# Patient Record
Sex: Male | Born: 1950 | Race: White | Hispanic: No | Smoking: Former smoker
Health system: Southern US, Community
[De-identification: ages and names within clinical notes are randomized; demographics above are authoritative.]

## PROBLEM LIST (undated history)

## (undated) DIAGNOSIS — J45909 Unspecified asthma, uncomplicated: Secondary | ICD-10-CM

## (undated) DIAGNOSIS — I5189 Other ill-defined heart diseases: Secondary | ICD-10-CM

## (undated) DIAGNOSIS — M1990 Unspecified osteoarthritis, unspecified site: Secondary | ICD-10-CM

## (undated) DIAGNOSIS — I10 Essential (primary) hypertension: Secondary | ICD-10-CM

## (undated) DIAGNOSIS — J439 Emphysema, unspecified: Secondary | ICD-10-CM

## (undated) DIAGNOSIS — M109 Gout, unspecified: Secondary | ICD-10-CM

## (undated) DIAGNOSIS — IMO0002 Reserved for concepts with insufficient information to code with codable children: Secondary | ICD-10-CM

## (undated) DIAGNOSIS — Z8701 Personal history of pneumonia (recurrent): Secondary | ICD-10-CM

## (undated) HISTORY — PX: OTHER SURGICAL HISTORY: SHX169

## (undated) HISTORY — PX: VASECTOMY: SHX75

## (undated) HISTORY — PX: DENTAL SURGERY: SHX609

## (undated) HISTORY — DX: Reserved for concepts with insufficient information to code with codable children: IMO0002

## (undated) HISTORY — DX: Essential (primary) hypertension: I10

## (undated) HISTORY — DX: Personal history of pneumonia (recurrent): Z87.01

## (undated) HISTORY — DX: Other ill-defined heart diseases: I51.89

## (undated) HISTORY — PX: CIRCUMCISION: SUR203

## (undated) HISTORY — DX: Emphysema, unspecified: J43.9

---

## 2014-06-18 ENCOUNTER — Emergency Department (HOSPITAL_COMMUNITY): Payer: Self-pay

## 2014-06-18 ENCOUNTER — Inpatient Hospital Stay (HOSPITAL_COMMUNITY)
Admission: EM | Admit: 2014-06-18 | Discharge: 2014-06-20 | DRG: 193 | Disposition: A | Discharge status: Home / Self Care | Payer: Self-pay | Attending: Family Medicine | Admitting: Family Medicine

## 2014-06-18 ENCOUNTER — Encounter (HOSPITAL_COMMUNITY): Payer: Self-pay | Admitting: Emergency Medicine

## 2014-06-18 DIAGNOSIS — Z6832 Body mass index (BMI) 32.0-32.9, adult: Secondary | ICD-10-CM

## 2014-06-18 DIAGNOSIS — I10 Essential (primary) hypertension: Secondary | ICD-10-CM | POA: Insufficient documentation

## 2014-06-18 DIAGNOSIS — R0902 Hypoxemia: Secondary | ICD-10-CM | POA: Diagnosis present

## 2014-06-18 DIAGNOSIS — I272 Other secondary pulmonary hypertension: Secondary | ICD-10-CM | POA: Diagnosis present

## 2014-06-18 DIAGNOSIS — J90 Pleural effusion, not elsewhere classified: Secondary | ICD-10-CM | POA: Diagnosis present

## 2014-06-18 DIAGNOSIS — F17220 Nicotine dependence, chewing tobacco, uncomplicated: Secondary | ICD-10-CM | POA: Diagnosis present

## 2014-06-18 DIAGNOSIS — T501X5A Adverse effect of loop [high-ceiling] diuretics, initial encounter: Secondary | ICD-10-CM | POA: Diagnosis present

## 2014-06-18 DIAGNOSIS — I426 Alcoholic cardiomyopathy: Secondary | ICD-10-CM | POA: Diagnosis present

## 2014-06-18 DIAGNOSIS — I509 Heart failure, unspecified: Secondary | ICD-10-CM

## 2014-06-18 DIAGNOSIS — J9601 Acute respiratory failure with hypoxia: Secondary | ICD-10-CM | POA: Diagnosis present

## 2014-06-18 DIAGNOSIS — M25519 Pain in unspecified shoulder: Secondary | ICD-10-CM | POA: Diagnosis present

## 2014-06-18 DIAGNOSIS — D696 Thrombocytopenia, unspecified: Secondary | ICD-10-CM | POA: Diagnosis present

## 2014-06-18 DIAGNOSIS — I5032 Chronic diastolic (congestive) heart failure: Secondary | ICD-10-CM | POA: Diagnosis present

## 2014-06-18 DIAGNOSIS — E876 Hypokalemia: Secondary | ICD-10-CM | POA: Diagnosis present

## 2014-06-18 DIAGNOSIS — R0602 Shortness of breath: Secondary | ICD-10-CM

## 2014-06-18 DIAGNOSIS — R509 Fever, unspecified: Secondary | ICD-10-CM

## 2014-06-18 DIAGNOSIS — D6959 Other secondary thrombocytopenia: Secondary | ICD-10-CM | POA: Diagnosis present

## 2014-06-18 DIAGNOSIS — F1010 Alcohol abuse, uncomplicated: Secondary | ICD-10-CM | POA: Diagnosis present

## 2014-06-18 DIAGNOSIS — D649 Anemia, unspecified: Secondary | ICD-10-CM | POA: Diagnosis present

## 2014-06-18 DIAGNOSIS — M109 Gout, unspecified: Secondary | ICD-10-CM | POA: Diagnosis present

## 2014-06-18 DIAGNOSIS — F1290 Cannabis use, unspecified, uncomplicated: Secondary | ICD-10-CM | POA: Diagnosis present

## 2014-06-18 DIAGNOSIS — Z23 Encounter for immunization: Secondary | ICD-10-CM

## 2014-06-18 DIAGNOSIS — Z8673 Personal history of transient ischemic attack (TIA), and cerebral infarction without residual deficits: Secondary | ICD-10-CM

## 2014-06-18 DIAGNOSIS — J189 Pneumonia, unspecified organism: Principal | ICD-10-CM | POA: Diagnosis present

## 2014-06-18 HISTORY — DX: Gout, unspecified: M10.9

## 2014-06-18 LAB — CBC
HCT: 33.6 % — ABNORMAL LOW (ref 39.0–52.0)
Hemoglobin: 11 g/dL — ABNORMAL LOW (ref 13.0–17.0)
MCH: 31.7 pg (ref 26.0–34.0)
MCHC: 32.7 g/dL (ref 30.0–36.0)
MCV: 96.8 fL (ref 78.0–100.0)
PLATELETS: 164 10*3/uL (ref 150–400)
RBC: 3.47 MIL/uL — AB (ref 4.22–5.81)
RDW: 19.4 % — ABNORMAL HIGH (ref 11.5–15.5)
WBC: 12.2 10*3/uL — ABNORMAL HIGH (ref 4.0–10.5)

## 2014-06-18 LAB — URINALYSIS, ROUTINE W REFLEX MICROSCOPIC
Glucose, UA: NEGATIVE mg/dL
Leukocytes, UA: NEGATIVE
Nitrite: NEGATIVE
Protein, ur: 30 mg/dL — AB
Urobilinogen, UA: 0.2 mg/dL (ref 0.0–1.0)
pH: 6 (ref 5.0–8.0)

## 2014-06-18 LAB — BASIC METABOLIC PANEL
Anion gap: 16 — ABNORMAL HIGH (ref 5–15)
BUN: 17 mg/dL (ref 6–23)
CO2: 27 mEq/L (ref 19–32)
CREATININE: 1.05 mg/dL (ref 0.50–1.35)
Calcium: 8.8 mg/dL (ref 8.4–10.5)
Chloride: 101 mEq/L (ref 96–112)
GFR calc non Af Amer: 74 mL/min — ABNORMAL LOW (ref >90)
GFR, EST AFRICAN AMERICAN: 85 mL/min — AB (ref >90)
GLUCOSE: 97 mg/dL (ref 70–99)
POTASSIUM: 2.9 meq/L — AB (ref 3.7–5.3)
Sodium: 144 mEq/L (ref 137–147)

## 2014-06-18 LAB — URINE MICROSCOPIC-ADD ON

## 2014-06-18 LAB — TROPONIN I

## 2014-06-18 LAB — PRO B NATRIURETIC PEPTIDE: Pro B Natriuretic peptide (BNP): 6259 pg/mL — ABNORMAL HIGH (ref 0–125)

## 2014-06-18 MED ORDER — CARVEDILOL 12.5 MG PO TABS
25.0000 mg | ORAL_TABLET | Freq: Two times a day (BID) | ORAL | Status: DC
  Administered 2014-06-18 – 2014-06-19 (×3): 25 mg via ORAL
  Filled 2014-06-18 (×4): qty 2

## 2014-06-18 MED ORDER — TRAMADOL HCL 50 MG PO TABS
50.0000 mg | ORAL_TABLET | Freq: Four times a day (QID) | ORAL | Status: DC | PRN
  Administered 2014-06-19 (×2): 50 mg via ORAL
  Filled 2014-06-18 (×2): qty 1

## 2014-06-18 MED ORDER — POTASSIUM CHLORIDE IN NACL 20-0.9 MEQ/L-% IV SOLN
INTRAVENOUS | Status: AC
  Administered 2014-06-18: 22:00:00 via INTRAVENOUS

## 2014-06-18 MED ORDER — PNEUMOCOCCAL VAC POLYVALENT 25 MCG/0.5ML IJ INJ
0.5000 mL | INJECTION | INTRAMUSCULAR | Status: AC
  Administered 2014-06-19: 0.5 mL via INTRAMUSCULAR
  Filled 2014-06-18: qty 0.5

## 2014-06-18 MED ORDER — INFLUENZA VAC SPLIT QUAD 0.5 ML IM SUSY
0.5000 mL | PREFILLED_SYRINGE | INTRAMUSCULAR | Status: AC
  Administered 2014-06-19: 0.5 mL via INTRAMUSCULAR
  Filled 2014-06-18: qty 0.5

## 2014-06-18 MED ORDER — CITALOPRAM HYDROBROMIDE 20 MG PO TABS
20.0000 mg | ORAL_TABLET | Freq: Every day | ORAL | Status: DC
  Administered 2014-06-18 – 2014-06-19 (×2): 20 mg via ORAL
  Filled 2014-06-18 (×2): qty 1

## 2014-06-18 MED ORDER — ACETAMINOPHEN 325 MG PO TABS
650.0000 mg | ORAL_TABLET | Freq: Four times a day (QID) | ORAL | Status: DC | PRN
  Administered 2014-06-19: 650 mg via ORAL
  Filled 2014-06-18: qty 2

## 2014-06-18 MED ORDER — ENOXAPARIN SODIUM 40 MG/0.4ML ~~LOC~~ SOLN
40.0000 mg | SUBCUTANEOUS | Status: DC
  Administered 2014-06-18: 40 mg via SUBCUTANEOUS
  Filled 2014-06-18: qty 0.4

## 2014-06-18 MED ORDER — LEVOFLOXACIN IN D5W 750 MG/150ML IV SOLN: 750.0000 mg | INTRAVENOUS | Status: DC

## 2014-06-18 MED ORDER — VITAMIN B-1 100 MG PO TABS
100.0000 mg | ORAL_TABLET | Freq: Every day | ORAL | Status: DC
  Administered 2014-06-19: 100 mg via ORAL
  Filled 2014-06-18: qty 1

## 2014-06-18 MED ORDER — ONDANSETRON HCL 4 MG/2ML IJ SOLN: 4.0000 mg | Freq: Three times a day (TID) | INTRAMUSCULAR | Status: AC | PRN

## 2014-06-18 MED ORDER — LEVOFLOXACIN IN D5W 750 MG/150ML IV SOLN
750.0000 mg | INTRAVENOUS | Status: DC
  Administered 2014-06-19: 750 mg via INTRAVENOUS
  Filled 2014-06-18: qty 150

## 2014-06-18 MED ORDER — LEVOFLOXACIN IN D5W 750 MG/150ML IV SOLN
750.0000 mg | Freq: Once | INTRAVENOUS | Status: AC
  Administered 2014-06-18: 750 mg via INTRAVENOUS
  Filled 2014-06-18: qty 150

## 2014-06-18 MED ORDER — LISINOPRIL 10 MG PO TABS
40.0000 mg | ORAL_TABLET | Freq: Two times a day (BID) | ORAL | Status: DC
  Administered 2014-06-18 – 2014-06-20 (×4): 40 mg via ORAL
  Filled 2014-06-18 (×4): qty 4

## 2014-06-18 MED ORDER — OXYCODONE HCL 5 MG PO TABS
5.0000 mg | ORAL_TABLET | Freq: Four times a day (QID) | ORAL | Status: DC | PRN
  Administered 2014-06-18 – 2014-06-20 (×4): 5 mg via ORAL
  Filled 2014-06-18 (×4): qty 1

## 2014-06-18 NOTE — ED Notes (Signed)
Got 50 mcg fentanyl IVP via EMS per Jeanice Lim in route to ER.

## 2014-06-18 NOTE — ED Notes (Signed)
Patient arrives from [facility] c/o left chest pain that started today and has been constant. H/o CHF. C/o left shoulder pain as well. States shoulder is worse than chest. No injury "for a long time" per patient. Patient received 325mg  ASA and 2 SL nitro at Office

## 2014-06-18 NOTE — ED Notes (Signed)
MD at bedside. 

## 2014-06-18 NOTE — ED Notes (Signed)
CRITICAL VALUE ALERT  Critical value received: Potassium  Date of notification:  06/18/2014  Time of notification:  3128  Critical value read back:Yes.    Nurse who received alert:  Allegra Lai, RN  MD notified (1st page):  Dr Sabra Heck  Time of first page:  [phone]

## 2014-06-18 NOTE — H&P (Signed)
Chief Complaint:  Not feeling well  HPI: 63 yo male h/o chf unk EF, htn comes in with a day or so of not feeling well, coughing and fever.  Some sob.  Had some cp with coughing.  No le edema or swelling.  No n/v/d.  No abd pain.  His neighbors kids have been sick a lot with uri symptoms.  Feeling better with tx received in ED and wishes to go home already.  Not on recent abx.  No dysuria.  Review of Systems:  Positive and negative as per HPI otherwise all other systems are negative  Past Medical History: Past Medical History  Diagnosis Date  . Gout   . Hypertension   . Stroke     TIA   Past Surgical History  Procedure Laterality Date  . Dental surgery    . Vasectomy    . Circumcision      Medications: Prior to Admission medications   Medication Sig Start Date End Date Taking? Authorizing Provider  thiamine (VITAMIN B-1) 100 MG tablet Take 100 mg by mouth daily.   Yes Historical Provider, MD    Allergies:  No Known Allergies  Social History:  reports that he has quit smoking. His smokeless tobacco use includes Chew. He reports that he drinks alcohol. He reports that he uses illicit drugs (Marijuana).  Family History: History reviewed. No pertinent family history.  Physical Exam: Filed Vitals:   06/18/14 1709 06/18/14 1712 06/18/14 1730  BP: 176/87 176/87 174/83  Pulse: 70 64 65  Temp:  100.6 F (38.1 C)   TempSrc:  Oral   Resp: 11 27 23   Height:  5\' 8"  (1.727 m)   Weight:  95.255 kg (210 lb)   SpO2: 95% 92% 91%   General appearance: alert, cooperative and no distress  Appears dry Head: Normocephalic, without obvious abnormality, atraumatic Eyes: negative Nose: Nares normal. Septum midline. Mucosa normal. No drainage or sinus tenderness. Neck: no JVD and supple, symmetrical, trachea midline Lungs: clear to auscultation bilaterally Heart: regular rate and rhythm, S1, S2 normal, no murmur, click, rub or gallop Abdomen: soft, non-tender; bowel sounds normal; no  masses,  no organomegaly Extremities: extremities normal, atraumatic, no cyanosis or edema Pulses: 2+ and symmetric Skin: Skin color, texture, turgor normal. No rashes or lesions    Labs on Admission:   Recent Labs  06/18/14 1732  NA 144  K 2.9*  CL 101  CO2 27  GLUCOSE 97  BUN 17  CREATININE 1.05  CALCIUM 8.8    Recent Labs  06/18/14 1732  WBC 12.2*  HGB 11.0*  HCT 33.6*  MCV 96.8  PLT 164    Recent Labs  06/18/14 1732  TROPONINI <0.30   Radiological Exams on Admission: Dg Chest 2 View  06/18/2014   CLINICAL DATA:  Left-sided chest pain radiating to the shoulder and arm. Shortness of breath. Symptoms began today. Personal history of hypertension congestive heart failure.  EXAM: CHEST  2 VIEW  COMPARISON:  None.  FINDINGS: Artifact overlies the chest. The heart is enlarged. There is calcification of the thoracic aorta. There are small bilateral pleural effusions. There is patchy density both lung bases could represent atelectasis and or mild basilar pneumonia. The upper lungs are clear. No acute bony finding.  IMPRESSION: Cardiomegaly. Small effusions. Basilar atelectasis and or pneumonia. Findings could go along with low level congestive heart failure.   Electronically Signed   By: Nelson Chimes M.D.   On: 06/18/2014 17:59  Assessment/Plan  63 yo male with h/o chf now with CAP  Principal Problem:   CAP (community acquired pneumonia)-  pna pathway.  Levaquin.  Mildly hypoxic.  Keep overnight on gentle ivf.  Would walk and ck oxygen sats.  If nml, consider d/c tomorrow afternoon on levaquin if he feels up to it.  Lasix is held.  Active Problems:   SOB (shortness of breath)   CHF with unknown LVEF-  Lasix is held.  Will ck echo in am to get better idea of how significant his systolic dysfunction is if any.   Pleural effusion, bilateral   Hypertension   Hypokalemia-  Replete iv and thru ivf overnight   Hypoxia  Admit to tele.  Full code.  Arlett Goold  A 06/18/2014, 7:44 PM

## 2014-06-18 NOTE — ED Provider Notes (Signed)
CSN: [id number]     Arrival date & time 06/18/14  1707 History   First MD Initiated Contact with Patient 06/18/14 1713     Chief Complaint  Patient presents with  . Chest Pain     (Consider location/radiation/quality/duration/timing/severity/associated sxs/prior Treatment) HPI Comments: The patient is a 63 year old male with a history of gout, hypertension and a stroke who also has congestive heart failure. He was seen at his family doctor's office today because of left-sided chest pain that started when he woke up this morning. It is located in the left chest, it does not radiate, it is associated with some shortness of breath and he notes that he was up all night long last night urinating frequently. He denies any peripheral edema, he has not had recent surgery though he was hospitalized in July for 2 nights at [facility] because of what he describes as low potassium. The patient denies any lower extremity edema, injuries, immobilization or prior DVT. At the family doctor's office he was given aspirin and nitroglycerin and sent to the emergency department because of his ongoing chest pain. The patient also reports that he has a history of gout and has been having pain in his hands and his left shoulder, he has pains in the bilateral ankles, these are common for him. He denies having fever, denies neck pain, denies radiation of this chest pain. He describes it as sharp, he also describes it as dull and then says "I'm not good at describing these things".  Patient is a 63 y.o. male presenting with chest pain. The history is provided by the patient.  Chest Pain   Past Medical History  Diagnosis Date  . Gout   . Hypertension   . Stroke     TIA  . CHF (congestive heart failure)     systolic   Past Surgical History  Procedure Laterality Date  . Dental surgery    . Vasectomy    . Circumcision     History reviewed. No pertinent family history. History  Substance Use  Topics  . Smoking status: Former Research scientist (life sciences)  . Smokeless tobacco: Current User    Types: Chew  . Alcohol Use: Yes    Review of Systems  Cardiovascular: Positive for chest pain.  All other systems reviewed and are negative.     Allergies  Review of patient's allergies indicates no known allergies.  Home Medications   Prior to Admission medications   Medication Sig Start Date End Date Taking? Authorizing Provider  carvedilol (COREG) 25 MG tablet Take 25 mg by mouth 2 (two) times daily with a meal.   Yes Historical Provider, MD  citalopram (CELEXA) 20 MG tablet Take 20 mg by mouth daily.   Yes Historical Provider, MD  furosemide (LASIX) 40 MG tablet Take 40 mg by mouth daily.   Yes Historical Provider, MD  lisinopril (PRINIVIL,ZESTRIL) 40 MG tablet Take 40 mg by mouth 2 (two) times daily.   Yes Historical Provider, MD  thiamine (VITAMIN B-1) 100 MG tablet Take 100 mg by mouth daily.   Yes Historical Provider, MD   BP 143/63 mmHg  Pulse 70  Temp(Src) 100.3 F (37.9 C) (Oral)  Resp 20  Ht 5\' 8"  (1.727 m)  Wt 214 lb 4.6 oz (97.2 kg)  BMI 32.59 kg/m2  SpO2 93% Physical Exam  Constitutional: He appears well-developed and well-nourished. No distress.  HENT:  Head: Normocephalic and atraumatic.  Mouth/Throat: Oropharynx is clear and moist. No oropharyngeal exudate.  Eyes: Conjunctivae and EOM are normal. Pupils are equal, round, and reactive to light. Right eye exhibits no discharge. Left eye exhibits no discharge. No scleral icterus.  Neck: Normal range of motion. Neck supple. No JVD present. No thyromegaly present.  Cardiovascular: Normal rate, regular rhythm, normal heart sounds and intact distal pulses.  Exam reveals no gallop and no friction rub.   No murmur heard. Pulmonary/Chest: Breath sounds normal. No respiratory distress. He has no wheezes. He has no rales.  Mild tachypnea, speaks in full sentences  Abdominal: Soft. Bowel sounds are normal. He exhibits no distension and no  mass. There is no tenderness.  Musculoskeletal: Normal range of motion. He exhibits no edema or tenderness.  Lymphadenopathy:    He has no cervical adenopathy.  Neurological: He is alert. Coordination normal.  Skin: Skin is warm and dry. No rash noted. No erythema.  Psychiatric: He has a normal mood and affect. His behavior is normal.  Nursing note and vitals reviewed.   ED Course  Procedures (including critical care time) Labs Review Labs Reviewed  BASIC METABOLIC PANEL - Abnormal; Notable for the following:    Potassium 2.9 (*)    GFR calc non Af Amer 74 (*)    GFR calc Af Amer 85 (*)    Anion gap 16 (*)    All other components within normal limits  CBC - Abnormal; Notable for the following:    WBC 12.2 (*)    RBC 3.47 (*)    Hemoglobin 11.0 (*)    HCT 33.6 (*)    RDW 19.4 (*)    All other components within normal limits  URINALYSIS, ROUTINE W REFLEX MICROSCOPIC - Abnormal; Notable for the following:    Specific Gravity, Urine >1.030 (*)    Hgb urine dipstick MODERATE (*)    Bilirubin Urine SMALL (*)    Ketones, ur TRACE (*)    Protein, ur 30 (*)    All other components within normal limits  PRO B NATRIURETIC PEPTIDE - Abnormal; Notable for the following:    Pro B Natriuretic peptide (BNP) 6259.0 (*)    All other components within normal limits  BASIC METABOLIC PANEL - Abnormal; Notable for the following:    Potassium 3.1 (*)    Glucose, Bld 105 (*)    Calcium 8.3 (*)    GFR calc non Af Amer 86 (*)    All other components within normal limits  CBC WITH DIFFERENTIAL - Abnormal; Notable for the following:    WBC 11.9 (*)    RBC 3.35 (*)    Hemoglobin 10.7 (*)    HCT 32.2 (*)    RDW 19.1 (*)    Platelets 128 (*)    Neutrophils Relative % 81 (*)    Neutro Abs 9.7 (*)    Lymphocytes Relative 8 (*)    Monocytes Absolute 1.3 (*)    All other components within normal limits  URIC ACID - Abnormal; Notable for the following:    Uric Acid, Serum 9.8 (*)    All other  components within normal limits  CULTURE, BLOOD (ROUTINE X 2)  CULTURE, BLOOD (ROUTINE X 2)  TROPONIN I  URINE MICROSCOPIC-ADD ON  STREP PNEUMONIAE URINARY ANTIGEN  LEGIONELLA ANTIGEN, URINE    Imaging Review Dg Chest 2 View  06/18/2014   CLINICAL DATA:  Left-sided chest pain radiating to the shoulder and arm. Shortness of breath. Symptoms began today. Personal history of hypertension congestive heart failure.  EXAM: CHEST  2 VIEW  COMPARISON:  None.  FINDINGS: Artifact overlies the chest. The heart is enlarged. There is calcification of the thoracic aorta. There are small bilateral pleural effusions. There is patchy density both lung bases could represent atelectasis and or mild basilar pneumonia. The upper lungs are clear. No acute bony finding.  IMPRESSION: Cardiomegaly. Small effusions. Basilar atelectasis and or pneumonia. Findings could go along with low level congestive heart failure.   Electronically Signed   By: Nelson Chimes M.D.   On: 06/18/2014 17:59     EKG Interpretation   Date/Time:  Tuesday June 18 2014 17:11:42 EST Ventricular Rate:  67 PR Interval:  202 QRS Duration: 89 QT Interval:  538 QTC Calculation: 568 R Axis:   51 Text Interpretation:  Sinus rhythm Nonspecific T wave abnormality  Prolonged QT interval Baseline wander in lead(s) I II aVR V6 No old  tracing to compare Confirmed by Gursimran Litaker  MD, [place] ([id number]) on 06/18/2014  5:29:13 PM      MDM   Final diagnoses:  CAP (community acquired pneumonia)  Hypokalemia    The patient has an EKG which is rather unremarkable, his respiratory pattern is tachypneic but there is no abnormal lung sounds, his oxygen is 94% on room air but he does appear mildly dyspneic. His vital signs suggest a slight fever of 100.6, he is not tachycardic, his blood pressure is elevated and his oxygen level is slightly low. I will obtain a chest x-ray and lab work   The patient will be admitted to the hospitalist service, labs confirm  a mild leukocytosis as well as an elevated BNP consistent with congestive heart failure. Discussed with hospitalist  Meds given in ED:  Medications  ondansetron (ZOFRAN) injection 4 mg (not administered)  lisinopril (PRINIVIL,ZESTRIL) tablet 40 mg (40 mg Oral Given 06/19/14 0812)  carvedilol (COREG) tablet 25 mg (25 mg Oral Given 06/19/14 0812)  citalopram (CELEXA) tablet 20 mg (20 mg Oral Given 06/18/14 2243)  enoxaparin (LOVENOX) injection 40 mg (40 mg Subcutaneous Given 06/18/14 2152)  0.9 % NaCl with KCl 20 mEq/ L  infusion ( Intravenous Not Given 06/19/14 0851)  levofloxacin (LEVAQUIN) IVPB 750 mg (not administered)  acetaminophen (TYLENOL) tablet 650 mg (650 mg Oral Given 06/19/14 1502)  traMADol (ULTRAM) tablet 50 mg (50 mg Oral Given 06/19/14 0229)  oxyCODONE (Oxy IR/ROXICODONE) immediate release tablet 5 mg (5 mg Oral Given 06/19/14 1022)  ipratropium-albuterol (DUONEB) 0.5-2.5 (3) MG/3ML nebulizer solution 3 mL (3 mLs Nebulization Given 06/19/14 1501)  predniSONE (DELTASONE) tablet 50 mg (50 mg Oral Given 06/19/14 1005)  0.9 % NaCl with KCl 20 mEq/ L  infusion ( Intravenous New Bag/Given 06/19/14 1019)  LORazepam (ATIVAN) tablet 1 mg (not administered)    Or  LORazepam (ATIVAN) injection 1 mg (not administered)  thiamine (VITAMIN B-1) tablet 100 mg (100 mg Oral Given 06/19/14 1022)    Or  thiamine (B-1) injection 100 mg ( Intravenous See Alternative 95/18/84 1660)  folic acid (FOLVITE) tablet 1 mg (1 mg Oral Given 06/19/14 1022)  multivitamin with minerals tablet 1 tablet (1 tablet Oral Given 06/19/14 1022)  levofloxacin (LEVAQUIN) IVPB 750 mg (750 mg Intravenous Transfusing/Transfer 06/18/14 2108)  Influenza vac split quadrivalent PF (FLUARIX) injection 0.5 mL (0.5 mLs Intramuscular Given 06/19/14 1005)  pneumococcal 23 valent vaccine (PNU-IMMUNE) injection 0.5 mL (0.5 mLs Intramuscular Given 06/19/14 1005)  potassium chloride SA (K-DUR,KLOR-CON) CR tablet 40 mEq (40 mEq Oral  Given 06/19/14 1005)    Current Discharge Medication List  Johnna Acosta, MD 06/19/14 1600

## 2014-06-19 ENCOUNTER — Encounter (HOSPITAL_COMMUNITY): Payer: Self-pay | Admitting: Internal Medicine

## 2014-06-19 DIAGNOSIS — M109 Gout, unspecified: Secondary | ICD-10-CM | POA: Diagnosis present

## 2014-06-19 DIAGNOSIS — F1010 Alcohol abuse, uncomplicated: Secondary | ICD-10-CM | POA: Diagnosis present

## 2014-06-19 DIAGNOSIS — D696 Thrombocytopenia, unspecified: Secondary | ICD-10-CM | POA: Diagnosis present

## 2014-06-19 DIAGNOSIS — I359 Nonrheumatic aortic valve disorder, unspecified: Secondary | ICD-10-CM

## 2014-06-19 DIAGNOSIS — M25519 Pain in unspecified shoulder: Secondary | ICD-10-CM | POA: Diagnosis present

## 2014-06-19 LAB — CBC WITH DIFFERENTIAL/PLATELET
BASOS ABS: 0 10*3/uL (ref 0.0–0.1)
BASOS PCT: 0 % (ref 0–1)
Eosinophils Absolute: 0 10*3/uL (ref 0.0–0.7)
Eosinophils Relative: 0 % (ref 0–5)
HCT: 32.2 % — ABNORMAL LOW (ref 39.0–52.0)
Hemoglobin: 10.7 g/dL — ABNORMAL LOW (ref 13.0–17.0)
Lymphocytes Relative: 8 % — ABNORMAL LOW (ref 12–46)
Lymphs Abs: 1 10*3/uL (ref 0.7–4.0)
MCH: 31.9 pg (ref 26.0–34.0)
MCHC: 33.2 g/dL (ref 30.0–36.0)
MCV: 96.1 fL (ref 78.0–100.0)
Monocytes Absolute: 1.3 10*3/uL — ABNORMAL HIGH (ref 0.1–1.0)
Monocytes Relative: 11 % (ref 3–12)
NEUTROS ABS: 9.7 10*3/uL — AB (ref 1.7–7.7)
NEUTROS PCT: 81 % — AB (ref 43–77)
PLATELETS: 128 10*3/uL — AB (ref 150–400)
RBC: 3.35 MIL/uL — ABNORMAL LOW (ref 4.22–5.81)
RDW: 19.1 % — AB (ref 11.5–15.5)
WBC: 11.9 10*3/uL — ABNORMAL HIGH (ref 4.0–10.5)

## 2014-06-19 LAB — STREP PNEUMONIAE URINARY ANTIGEN: STREP PNEUMO URINARY ANTIGEN: NEGATIVE

## 2014-06-19 LAB — BASIC METABOLIC PANEL
ANION GAP: 15 (ref 5–15)
BUN: 16 mg/dL (ref 6–23)
CALCIUM: 8.3 mg/dL — AB (ref 8.4–10.5)
CO2: 27 mEq/L (ref 19–32)
CREATININE: 0.97 mg/dL (ref 0.50–1.35)
Chloride: 98 mEq/L (ref 96–112)
GFR, EST NON AFRICAN AMERICAN: 86 mL/min — AB (ref >90)
Glucose, Bld: 105 mg/dL — ABNORMAL HIGH (ref 70–99)
Potassium: 3.1 mEq/L — ABNORMAL LOW (ref 3.7–5.3)
SODIUM: 140 meq/L (ref 137–147)

## 2014-06-19 LAB — URIC ACID: URIC ACID, SERUM: 9.8 mg/dL — AB (ref 4.0–7.8)

## 2014-06-19 MED ORDER — IPRATROPIUM-ALBUTEROL 0.5-2.5 (3) MG/3ML IN SOLN
3.0000 mL | RESPIRATORY_TRACT | Status: DC
  Administered 2014-06-19 (×3): 3 mL via RESPIRATORY_TRACT
  Filled 2014-06-19 (×3): qty 3

## 2014-06-19 MED ORDER — PREDNISONE 20 MG PO TABS: 50.0000 mg | ORAL_TABLET | Freq: Every day | ORAL | Status: DC

## 2014-06-19 MED ORDER — POTASSIUM CHLORIDE IN NACL 20-0.9 MEQ/L-% IV SOLN
INTRAVENOUS | Status: DC
  Administered 2014-06-19: 10:00:00 via INTRAVENOUS

## 2014-06-19 MED ORDER — FOLIC ACID 1 MG PO TABS
1.0000 mg | ORAL_TABLET | Freq: Every day | ORAL | Status: DC
  Administered 2014-06-19 – 2014-06-20 (×2): 1 mg via ORAL
  Filled 2014-06-19 (×2): qty 1

## 2014-06-19 MED ORDER — ADULT MULTIVITAMIN W/MINERALS CH
1.0000 | ORAL_TABLET | Freq: Every day | ORAL | Status: DC
  Administered 2014-06-19 – 2014-06-20 (×2): 1 via ORAL
  Filled 2014-06-19 (×2): qty 1

## 2014-06-19 MED ORDER — LORAZEPAM 2 MG/ML IJ SOLN: 1.0000 mg | Freq: Four times a day (QID) | INTRAMUSCULAR | Status: DC | PRN

## 2014-06-19 MED ORDER — IPRATROPIUM-ALBUTEROL 0.5-2.5 (3) MG/3ML IN SOLN
3.0000 mL | Freq: Four times a day (QID) | RESPIRATORY_TRACT | Status: DC
  Administered 2014-06-20 (×3): 3 mL via RESPIRATORY_TRACT
  Filled 2014-06-19 (×3): qty 3

## 2014-06-19 MED ORDER — ALBUTEROL SULFATE (2.5 MG/3ML) 0.083% IN NEBU: 2.5000 mg | INHALATION_SOLUTION | RESPIRATORY_TRACT | Status: DC | PRN

## 2014-06-19 MED ORDER — PREDNISONE 20 MG PO TABS
50.0000 mg | ORAL_TABLET | Freq: Every day | ORAL | Status: DC
  Administered 2014-06-19 – 2014-06-20 (×2): 50 mg via ORAL
  Filled 2014-06-19 (×2): qty 2
  Filled 2014-06-19 (×2): qty 1

## 2014-06-19 MED ORDER — THIAMINE HCL 100 MG/ML IJ SOLN: 100.0000 mg | Freq: Every day | INTRAMUSCULAR | Status: DC

## 2014-06-19 MED ORDER — POTASSIUM CHLORIDE CRYS ER 20 MEQ PO TBCR
40.0000 meq | EXTENDED_RELEASE_TABLET | Freq: Once | ORAL | Status: AC
  Administered 2014-06-19: 40 meq via ORAL
  Filled 2014-06-19: qty 2

## 2014-06-19 MED ORDER — LORAZEPAM 1 MG PO TABS: 1.0000 mg | ORAL_TABLET | Freq: Four times a day (QID) | ORAL | Status: DC | PRN

## 2014-06-19 MED ORDER — VITAMIN B-1 100 MG PO TABS
100.0000 mg | ORAL_TABLET | Freq: Every day | ORAL | Status: DC
  Administered 2014-06-19 – 2014-06-20 (×2): 100 mg via ORAL
  Filled 2014-06-19 (×2): qty 1

## 2014-06-19 NOTE — Progress Notes (Signed)
O2 Saturations  Patient's O2 saturation is 93% on room air at rest. Patient's O2 saturation is 95% on room air with activity.

## 2014-06-19 NOTE — Plan of Care (Signed)
Problem: Consults Goal: Skin Care Protocol Initiated - if Braden Score 18 or less If consults are not indicated, leave blank or document N/A  Outcome: Not Applicable Date Met:  30/13/14 Goal: Diabetes Guidelines if Diabetic/Glucose > 140 If diabetic or lab glucose is > 140 mg/dl - Initiate Diabetes/Hyperglycemia Guidelines & Document Interventions  Outcome: Not Applicable Date Met:  38/88/75  Problem: Phase I Progression Outcomes Goal: Dyspnea controlled at rest Outcome: Completed/Met Date Met:  06/19/14 Goal: First antibiotic given within 6hrs of admit Outcome: Completed/Met Date Met:  06/19/14

## 2014-06-19 NOTE — Progress Notes (Signed)
[facility] PROGRESS NOTE  Joseph Chen PRF:[id number] DOB: 1951-04-11 DOA: 06/18/2014 PCP: No primary care provider on file.  Assessment/Plan: CAP (community acquired pneumonia)- currently afebrile and non-toxic. Leukocytosis trending downward. Remains somewhat sob with audible tightness. Oxygen saturation level 97% on room air at rest. Strep pneumo and legionella antigens pending.  Continue Levaquin day #2. Prednisone 50mg  once with nebs.  will ambulate and monitor oxygen saturation level.   Active Problems:   CHF-BNP elevated.  chart review reveals last seen cards 2011 at [facility]. Echo dated 2010 yields EF 40%. Does not appear fluid overloaded in fact appears slightly dry. Some upper lobe wheeze/tightness. Continue to hold lasix. Provide nebs.  Lost to follow up until 03/2014 when he "got back on meds" with PCP Dr Bridget Hartshorn. Will continue coreg and lisinopril. Await echo. Continue IV fluids at slower rate. Daily weights and monitor intake and output.  Will likely need cards follow up.   SOB: likely multifactorial related to #1 and #2. No admitted or documented COPD but reports having inhaler. No hx smoking.  See above.    Hypertension: poor control with hx of same in setting of missed doses of meds. Lasix on hold as above. EKG with non-specific Twave abnormality and prolonged QT.  Will resume home medications and monitor.    Hypokalemia- trending up. Likely related to diuretics. EKG as above.  Will replete and recheck.   ETOH abuse: admits to 5-6 ozs hard liquor daily. Denies hx withdrawal. Will provide CIWA. Monitor  Thrombocytopenia: likely related to ETOH abuse. No s/sx bleeding. Will monitor.   Joint pain: left shoulder bilateral wrists. Hx gout. Chart review indicates used to take allopurinol. Will obtain uric acid level. Providing 50mg  prednisone. Consider colchecine or other NSAID.   Obesity: BMI 32. Nutritional consult   Code Status: full Family Communication: none  present Disposition Plan: home when ready likely tomorrow   Consultants:  none  Procedures:  Echo pending  Antibiotics:  Levaquin  HPI/Subjective: Sitting on side of bed. Reports continued SOB.  Objective: Filed Vitals:   06/19/14 0811  BP: 184/80  Pulse: 72  Temp:   Resp:     Intake/Output Summary (Last 24 hours) at 06/19/14 0904 Last data filed at 06/19/14 0700  Gross per 24 hour  Intake 646.25 ml  Output    576 ml  Net  70.25 ml   Filed Weights   06/18/14 1712 06/18/14 2128 06/19/14 0833  Weight: 95.255 kg (210 lb) 94.439 kg (208 lb 3.2 oz) 97.2 kg (214 lb 4.6 oz)    Exam:   General:  Obese appears comfortable  Cardiovascular: RRR No MGR No LE edema  Respiratory: mild increased work of breathing with conversation. Dry cough. No crackles. Diminished air flow bases. Some anterior wheeze/tightness  Abdomen: obese soft +BS non-tender to palpation  Musculoskeletal: left shoulder very tender to touch with very limited ROM. No swelling or heat. Bilateral wrists with mild edema/erythema and heat and very tender to touch with decreased ROM.    Data Reviewed: Basic Metabolic Panel:  Recent Labs Lab 06/18/14 1732 06/19/14 0622  NA 144 140  K 2.9* 3.1*  CL 101 98  CO2 27 27  GLUCOSE 97 105*  BUN 17 16  CREATININE 1.05 0.97  CALCIUM 8.8 8.3*   Liver Function Tests: No results for input(s): AST, ALT, ALKPHOS, BILITOT, PROT, ALBUMIN in the last 168 hours. No results for input(s): LIPASE, AMYLASE in the last 168 hours. No results for input(s): AMMONIA in the last  168 hours. CBC:  Recent Labs Lab 06/18/14 1732 06/19/14 0622  WBC 12.2* 11.9*  NEUTROABS  --  9.7*  HGB 11.0* 10.7*  HCT 33.6* 32.2*  MCV 96.8 96.1  PLT 164 128*   Cardiac Enzymes:  Recent Labs Lab 06/18/14 1732  TROPONINI <0.30   BNP (last 3 results)  Recent Labs  06/18/14 1732  PROBNP 6259.0*   CBG: No results for input(s): GLUCAP in the last 168 hours.  Recent  Results (from the past 240 hour(s))  Culture, blood (routine x 2) Call MD if unable to obtain prior to antibiotics being given     Status: None (Preliminary result)   Collection Time: 06/18/14  9:39 PM  Result Value Ref Range Status   Specimen Description BLOOD RIGHT ARM  Final   Special Requests BOTTLES DRAWN AEROBIC AND ANAEROBIC [place]  Final   Culture PENDING  Incomplete   Report Status PENDING  Incomplete  Culture, blood (routine x 2) Call MD if unable to obtain prior to antibiotics being given     Status: None (Preliminary result)   Collection Time: 06/18/14  9:39 PM  Result Value Ref Range Status   Specimen Description BLOOD RIGHT HAND  Final   Special Requests BOTTLES DRAWN AEROBIC AND ANAEROBIC 10CC EACH  Final   Culture PENDING  Incomplete   Report Status PENDING  Incomplete     Studies: Dg Chest 2 View  06/18/2014   CLINICAL DATA:  Left-sided chest pain radiating to the shoulder and arm. Shortness of breath. Symptoms began today. Personal history of hypertension congestive heart failure.  EXAM: CHEST  2 VIEW  COMPARISON:  None.  FINDINGS: Artifact overlies the chest. The heart is enlarged. There is calcification of the thoracic aorta. There are small bilateral pleural effusions. There is patchy density both lung bases could represent atelectasis and or mild basilar pneumonia. The upper lungs are clear. No acute bony finding.  IMPRESSION: Cardiomegaly. Small effusions. Basilar atelectasis and or pneumonia. Findings could go along with low level congestive heart failure.   Electronically Signed   By: Nelson Chimes M.D.   On: 06/18/2014 17:59    Scheduled Meds: . carvedilol  25 mg Oral BID WC  . citalopram  20 mg Oral Daily  . enoxaparin (LOVENOX) injection  40 mg Subcutaneous Q24H  . Influenza vac split quadrivalent PF  0.5 mL Intramuscular Tomorrow-1000  . ipratropium-albuterol  3 mL Nebulization Q4H  . levofloxacin (LEVAQUIN) IV  750 mg Intravenous Q24H  . lisinopril  40 mg  Oral BID  . pneumococcal 23 valent vaccine  0.5 mL Intramuscular Tomorrow-1000  . potassium chloride  40 mEq Oral Once  . predniSONE  50 mg Oral Q breakfast  . thiamine  100 mg Oral Daily   Continuous Infusions: . 0.9 % NaCl with KCl 20 mEq / L 75 mL/hr at 06/18/14 2151    Principal Problem:   CAP (community acquired pneumonia) Active Problems:   SOB (shortness of breath)   CHF with unknown LVEF   Fever   Pleural effusion, bilateral   Hypertension   Hypokalemia   Hypoxia   Pain in joint, shoulder region   Gout    Time spent: 45 minutes    [facility] Pager [phone]. If 7PM-7AM, please contact night-coverage at [email or web address], password [facility] 06/19/2014, 9:04 AM  LOS: 1 day

## 2014-06-19 NOTE — Progress Notes (Signed)
ANTIBIOTIC CONSULT NOTE - FOLLOW UP  Pharmacy Consult for Renal Dosing ABX Indication: pneumonia  No Known Allergies  Patient Measurements: Height: 5\' 8"  (172.7 cm) Weight: 214 lb 4.6 oz (97.2 kg) IBW/kg (Calculated) : 68.4   Vital Signs: Temp: 98.7 F (37.1 C) (11/11 0610) Temp Source: Oral (11/11 0610) BP: 184/80 mmHg (11/11 0811) Pulse Rate: 72 (11/11 0811) Intake/Output from previous day: 11/10 0701 - 11/11 0700 In: 646.3 [I.V.:646.3] Out: 576 [Urine:576] Intake/Output from this shift:    Labs:  Recent Labs  06/18/14 1732 06/19/14 0622  WBC 12.2* 11.9*  HGB 11.0* 10.7*  PLT 164 128*  CREATININE 1.05 0.97   Estimated Creatinine Clearance: 88.1 mL/min (by C-G formula based on Cr of 0.97). No results for input(s): VANCOTROUGH, VANCOPEAK, VANCORANDOM, GENTTROUGH, GENTPEAK, GENTRANDOM, TOBRATROUGH, TOBRAPEAK, TOBRARND, AMIKACINPEAK, AMIKACINTROU, AMIKACIN in the last 72 hours.   Microbiology: Recent Results (from the past 720 hour(s))  Culture, blood (routine x 2) Call MD if unable to obtain prior to antibiotics being given     Status: None (Preliminary result)   Collection Time: 06/18/14  9:39 PM  Result Value Ref Range Status   Specimen Description BLOOD RIGHT ARM  Final   Special Requests BOTTLES DRAWN AEROBIC AND ANAEROBIC [place]  Final   Culture PENDING  Incomplete   Report Status PENDING  Incomplete  Culture, blood (routine x 2) Call MD if unable to obtain prior to antibiotics being given     Status: None (Preliminary result)   Collection Time: 06/18/14  9:39 PM  Result Value Ref Range Status   Specimen Description BLOOD RIGHT HAND  Final   Special Requests BOTTLES DRAWN AEROBIC AND ANAEROBIC 10CC EACH  Final   Culture PENDING  Incomplete   Report Status PENDING  Incomplete    Anti-infectives    Start     Dose/Rate Route Frequency Ordered Stop   06/19/14 2000  levofloxacin (LEVAQUIN) IVPB 750 mg     750 mg100 mL/hr over 90 Minutes Intravenous Every  24 hours 06/18/14 2127 06/24/14 1959   06/18/14 2130  levofloxacin (LEVAQUIN) IVPB 750 mg  Status:  Discontinued     750 mg100 mL/hr over 90 Minutes Intravenous Every 24 hours 06/18/14 2122 06/18/14 2127   06/18/14 1930  levofloxacin (LEVAQUIN) IVPB 750 mg     750 mg100 mL/hr over 90 Minutes Intravenous  Once 06/18/14 1918 06/18/14 2120      Assessment: Okay for Protocol, no renal dysfunction noted.  Goal of Therapy:  Eradicate infection.   Plan:  Continue Levaquin 750mg  IV every 24 hours. Monitor labs and micro data.  Biagio Quint R 06/19/2014,10:31 AM

## 2014-06-19 NOTE — Care Management Utilization Note (Signed)
UR completed 

## 2014-06-19 NOTE — Progress Notes (Signed)
  Echocardiogram 2D Echocardiogram has been performed.  [place], [place] 06/19/2014, 3:05 PM

## 2014-06-20 LAB — BASIC METABOLIC PANEL
ANION GAP: 13 (ref 5–15)
BUN: 23 mg/dL (ref 6–23)
CALCIUM: 8.5 mg/dL (ref 8.4–10.5)
CO2: 27 mEq/L (ref 19–32)
Chloride: 99 mEq/L (ref 96–112)
Creatinine, Ser: 1.08 mg/dL (ref 0.50–1.35)
GFR, EST AFRICAN AMERICAN: 82 mL/min — AB (ref >90)
GFR, EST NON AFRICAN AMERICAN: 71 mL/min — AB (ref >90)
Glucose, Bld: 126 mg/dL — ABNORMAL HIGH (ref 70–99)
POTASSIUM: 3.8 meq/L (ref 3.7–5.3)
Sodium: 139 mEq/L (ref 137–147)

## 2014-06-20 LAB — CBC
HCT: 31.9 % — ABNORMAL LOW (ref 39.0–52.0)
Hemoglobin: 10.5 g/dL — ABNORMAL LOW (ref 13.0–17.0)
MCH: 31.9 pg (ref 26.0–34.0)
MCHC: 32.9 g/dL (ref 30.0–36.0)
MCV: 97 fL (ref 78.0–100.0)
PLATELETS: 145 10*3/uL — AB (ref 150–400)
RBC: 3.29 MIL/uL — AB (ref 4.22–5.81)
RDW: 19 % — AB (ref 11.5–15.5)
WBC: 16 10*3/uL — AB (ref 4.0–10.5)

## 2014-06-20 LAB — LEGIONELLA ANTIGEN, URINE

## 2014-06-20 MED ORDER — DOXYCYCLINE HYCLATE 100 MG PO TABS: 100.0000 mg | ORAL_TABLET | Freq: Two times a day (BID) | ORAL | Status: DC

## 2014-06-20 MED ORDER — LEVOFLOXACIN 750 MG PO TABS: 750.0000 mg | ORAL_TABLET | Freq: Every day | ORAL | Status: DC

## 2014-06-20 NOTE — Plan of Care (Signed)
Problem: Phase III Progression Outcomes Goal: O2 sats > or equal to 93% on room air Outcome: Adequate for Discharge

## 2014-06-20 NOTE — Plan of Care (Signed)
Problem: Phase I Progression Outcomes Goal: Pain controlled with appropriate interventions Outcome: Completed/Met Date Met:  06/20/14 Goal: OOB as tolerated unless otherwise ordered Outcome: Completed/Met Date Met:  06/20/14 Goal: Code status addressed with pt/family Outcome: Completed/Met Date Met:  06/20/14 Goal: Voiding-avoid urinary catheter unless indicated Outcome: Completed/Met Date Met:  06/20/14  Problem: Phase II Progression Outcomes Goal: Encourage coughing & deep breathing Outcome: Completed/Met Date Met:  06/20/14 Goal: Wean O2 if indicated Outcome: Completed/Met Date Met:  06/20/14 Goal: Pain controlled Outcome: Completed/Met Date Met:  06/20/14 Goal: Progress activity as tolerated unless otherwise ordered Outcome: Completed/Met Date Met:  06/20/14 Goal: Discharge plan established Outcome: Completed/Met Date Met:  06/20/14 Goal: Tolerating diet Outcome: Completed/Met Date Met:  06/20/14

## 2014-06-20 NOTE — Discharge Summary (Addendum)
Physician Discharge Summary  Patrice Moates ZOX:[id number] DOB: Apr 12, 1951 DOA: 06/18/2014  PCP: No primary care provider on file.  Admit date: 06/18/2014 Discharge date: 06/20/2014  Recommendations for Outpatient Follow-up:  1. Resolution of community-acquired pneumonia  2. Chronic diastolic heart failure 3. Consider outpatient cardiology follow-up 4. Recommend moderation of alcohol intake or abstinence 5. Modest thrombocytopenia and anemia likely related to alcohol use. Consider repeat CBC as an outpatient. 6. gout   Follow-up Information    Follow up with [facility]. Schedule an appointment as soon as possible for a visit in 1 week.   Contact information:   [address] [place] [address] [phone]      Discharge Diagnoses:  1. Community acquired pneumonia with acute hypoxic respiratory failure 2. Normocytic anemia, thrombocytopenia, possibly related to alcohol use 3. Chronic diastolic heart failure 4. Alcohol abuse 5. Gout 6. History of tobacco dependence 7. Pulmonary hypertension seen on echocardiogram  Discharge Condition: improved Disposition: home  Diet recommendation: heart healthy diet  Filed Weights   06/18/14 2128 06/19/14 0833 06/20/14 0500  Weight: 94.439 kg (208 lb 3.2 oz) 97.2 kg (214 lb 4.6 oz) 98.431 kg (217 lb)    History of present illness:  63 year old man presented with acute onset of general malaise, coughing and fever with some shortness of breath and chest pain with coughing. Multiple sick contacts. Admitted for community-acquired pneumonia.  Hospital Course:  Patient was admitted treated with empiric antibiotics with a rapid improvement of shortness of breath and resolution of hypoxia. History and clinical findings suggested pneumonia rather than acute heart failure and he responded clinically to antibiotics. Hospitalization was uncomplicated. Individual issues as below.  1. Community acquired pneumonia  with acute hypoxic respiratory failure. Hypoxic respiratory failure has resolved. He had chest pain associated with this and cough which has resolved. EKG was nonacute. No further evaluation suggested. 2. Anemia and thrombocytopenia, suspect related to alcohol use. This remains stable and can be further evaluated in the outpatient starting. 3. Chronic diastolic heart failure. Stable. 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 This has remained compensated. 4. Alcohol abuse. No evidence of withdrawal. 5. Gout appears to be stable. I do not think it requires acute treatment. He feels better. 6. Hypertension has remained stable 7. History of tobacco dependence  Consultants:  none  Procedures: 2-D echocardiogram: LVEF50-55 percent. Normal wall motion. Grade 2 diastolic dysfunction. Severe pulmonary hypertension noted.  Antibiotics:  Levaquin 11/10 >> 11/11  Doxycycline 11/12 >> 11/18  Discharge Instructions  Discharge Instructions    Activity as tolerated - No restrictions    Complete by:  As directed      Diet - low sodium heart healthy    Complete by:  As directed      Discharge instructions    Complete by:  As directed   Call your physician or seek immediate medical attention for recurrent shortness of breath, fever, worsening cough or worsening of condition. Please talk to your doctor about your alcohol use.          Current Discharge Medication List    START taking these medications   Details  doxycycline (VIBRA-TABS) 100 MG tablet Take 1 tablet (100 mg total) by mouth every 12 (twelve) hours. Qty: 12 tablet, Refills: 0      CONTINUE these medications which have NOT CHANGED   Details  carvedilol (COREG) 25 MG tablet Take 25 mg by mouth 2 (two) times daily with a meal.    citalopram (  CELEXA) 20 MG tablet Take 20 mg by mouth daily.    furosemide (LASIX) 40 MG tablet Take 40 mg by mouth daily.    lisinopril (PRINIVIL,ZESTRIL) 40 MG tablet Take 40 mg by  mouth 2 (two) times daily.    thiamine (VITAMIN B-1) 100 MG tablet Take 100 mg by mouth daily.       No Known Allergies  The results of significant diagnostics from this hospitalization (including imaging, microbiology, ancillary and laboratory) are listed below for reference.    Significant Diagnostic Studies: Dg Chest 2 View  06/18/2014   CLINICAL DATA:  Left-sided chest pain radiating to the shoulder and arm. Shortness of breath. Symptoms began today. Personal history of hypertension congestive heart failure.  EXAM: CHEST  2 VIEW  COMPARISON:  None.  FINDINGS: Artifact overlies the chest. The heart is enlarged. There is calcification of the thoracic aorta. There are small bilateral pleural effusions. There is patchy density both lung bases could represent atelectasis and or mild basilar pneumonia. The upper lungs are clear. No acute bony finding.  IMPRESSION: Cardiomegaly. Small effusions. Basilar atelectasis and or pneumonia. Findings could go along with low level congestive heart failure.   Electronically Signed   By: Nelson Chimes M.D.   On: 06/18/2014 17:59    Microbiology: Recent Results (from the past 240 hour(s))  Culture, blood (routine x 2) Call MD if unable to obtain prior to antibiotics being given     Status: None (Preliminary result)   Collection Time: 06/18/14  9:39 PM  Result Value Ref Range Status   Specimen Description BLOOD RIGHT ARM  Final   Special Requests BOTTLES DRAWN AEROBIC AND ANAEROBIC [place]  Final   Culture NO GROWTH 2 DAYS  Final   Report Status PENDING  Incomplete  Culture, blood (routine x 2) Call MD if unable to obtain prior to antibiotics being given     Status: None (Preliminary result)   Collection Time: 06/18/14  9:39 PM  Result Value Ref Range Status   Specimen Description BLOOD RIGHT HAND  Final   Special Requests BOTTLES DRAWN AEROBIC AND ANAEROBIC 10CC EACH  Final   Culture NO GROWTH 2 DAYS  Final   Report Status PENDING  Incomplete      Labs: Basic Metabolic Panel:  Recent Labs Lab 06/18/14 1732 06/19/14 0622 06/20/14 0606  NA 144 140 139  K 2.9* 3.1* 3.8  CL 101 98 99  CO2 27 27 27   GLUCOSE 97 105* 126*  BUN 17 16 23   CREATININE 1.05 0.97 1.08  CALCIUM 8.8 8.3* 8.5   CBC:  Recent Labs Lab 06/18/14 1732 06/19/14 0622 06/20/14 0606  WBC 12.2* 11.9* 16.0*  NEUTROABS  --  9.7*  --   HGB 11.0* 10.7* 10.5*  HCT 33.6* 32.2* 31.9*  MCV 96.8 96.1 97.0  PLT 164 128* 145*   Cardiac Enzymes:  Recent Labs Lab 06/18/14 1732  TROPONINI <0.30     Recent Labs  06/18/14 1732  PROBNP 6259.0*    Principal Problem:   CAP (community acquired pneumonia) Active Problems:   SOB (shortness of breath)   CHF with unknown LVEF   Fever   Pleural effusion, bilateral   Hypertension   Hypokalemia   Hypoxia   Pain in joint, shoulder region   Gout   ETOH abuse   Thrombocytopenia   Morbid obesity   Time coordinating discharge: 35 minutes  Signed:  Murray Hodgkins, MD [facility] 06/20/2014, 4:25 PM

## 2014-06-20 NOTE — Care Management Note (Addendum)
    Page 1 of 1   06/20/2014     4:08:32 PM CARE MANAGEMENT NOTE 06/20/2014  Patient:  Joseph Chen, Joseph Chen   Account Number:  [id number]  Date Initiated:  06/20/2014  Documentation initiated by:  Theophilus Kinds  Subjective/Objective Assessment:   Pt admitted from home with pneumonia. Pt lives alone and will return home at discharge. Pt receives PCP care at the [facility] center. Pt has a cane and walker for home use.     Action/Plan:   Anticipate discharge today. No CM needs noted.   Anticipated DC Date:  06/20/2014   Anticipated DC Plan:  [place]  CM consult  [place] [facility]      Choice offered to / List presented to:             Status of service:  Completed, signed off Medicare Important Message given?   (If response is "NO", the following Medicare IM given date fields will be blank) Date Medicare IM given:   Medicare IM given by:   Date Additional Medicare IM given:   Additional Medicare IM given by:    Discharge Disposition:  HOME/SELF CARE  Per UR Regulation:    If discussed at Long Length of Stay Meetings, dates discussed:    Comments:  06/20/14 [place], RN BSN CM Pt discharging home today. [place] voucher given for medication assistance. Pt does not qualify for home O2.  06/20/14 [place], RN BSN CM

## 2014-06-20 NOTE — Progress Notes (Signed)
PROGRESS NOTE  Joseph Chen XFG:[id number] DOB: 03/13/1951 DOA: 06/18/2014 PCP: No primary care provider on file. [place] center  Summary: 63 year old man presented with acute onset of general malaise, coughing and fever with some shortness of breath and chest pain with coughing. Multiple sick contacts. Admitted for community-acquired pneumonia.  Assessment/Plan: 1. Community acquired pneumonia with acute hypoxic respiratory failure. Hypoxic respiratory failure has resolved. He had chest pain associated with this and cough which has resolved. EKG was nonacute. No further evaluation suggested. 2. Anemia and thrombocytopenia, suspect related to alcohol use. This remains stable and can be further evaluated in the outpatient starting. 3. Chronic diastolic heart failure. Stable. 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 This has remained compensated. 4. Alcohol abuse. No evidence of withdrawal. 5. Gout appears to be stable. I do not think it requires acute treatment. He feels better. 6. Hypertension has remained stable 7. History of tobacco dependence   Much improved. Plan discharge. Finish antibiotics as an outpatient.  Murray Hodgkins, MD  [facility]  Pager [phone] If 7PM-7AM, please contact night-coverage at [email or web address], password [facility] 06/20/2014, 2:09 PM  LOS: 2 days   Consultants:    Procedures: 2-D echocardiogram: LVEF50-55 percent. Normal wall motion. Grade 2 diastolic dysfunction. Severe pulmonary hypertension noted.  Antibiotics:  Levaquin 11/10 >> 11/11  Doxycycline 11/12 >> 11/18  HPI/Subjective: Feels well. Breathing well. No complaints. Feels ready to go home.  Objective: Filed Vitals:   06/20/14 0742 06/20/14 0817 06/20/14 1207 06/20/14 1336  BP:    108/57  Pulse:  60  56  Temp:    97.9 F (36.6 C)  TempSrc:    Oral  Resp:    20  Height:      Weight:      SpO2: 91%  91% 94%    Intake/Output Summary (Last 24  hours) at 06/20/14 1409 Last data filed at 06/20/14 1244  Gross per 24 hour  Intake    870 ml  Output    850 ml  Net     20 ml     Filed Weights   06/18/14 2128 06/19/14 0833 06/20/14 0500  Weight: 94.439 kg (208 lb 3.2 oz) 97.2 kg (214 lb 4.6 oz) 98.431 kg (217 lb)    Exam:     Afebrile, vital signs stable.  General: appears calm, comfortable. Lying flat in bed.  Psych: alert. Speech fluent and clear.  CV: regular rate and rhythm. No murmur, rub or gallop.  Respiratory: clear to auscultation bilaterally. No wheezes, rales or rhonchi. Normal respiratory effort.  Data Reviewed:  Basic metabolic panel unremarkable. Potassium normal.  Hemoglobin stable 10.5. Platelet count stable 145. WBC elevated but patient on steroids.  Scheduled Meds: . carvedilol  25 mg Oral BID WC  . citalopram  20 mg Oral Daily  . folic acid  1 mg Oral Daily  . ipratropium-albuterol  3 mL Nebulization QID  . levofloxacin  750 mg Oral q1800  . lisinopril  40 mg Oral BID  . multivitamin with minerals  1 tablet Oral Daily  . predniSONE  50 mg Oral Q breakfast  . thiamine  100 mg Oral Daily   Or  . thiamine  100 mg Intravenous Daily   Continuous Infusions:   Principal Problem:   CAP (community acquired pneumonia) Active Problems:   SOB (shortness of breath)   CHF with unknown LVEF   Fever   Pleural effusion, bilateral   Hypertension   Hypokalemia   Hypoxia   Pain  in joint, shoulder region   Gout   ETOH abuse   Thrombocytopenia   Morbid obesity

## 2014-06-20 NOTE — Progress Notes (Signed)
PHARMACIST - PHYSICIAN COMMUNICATION DR:   Sarajane Jews CONCERNING: Antibiotic IV to Oral Route Change Policy  RECOMMENDATION: This patient is receiving Levaquin by the intravenous route.  Based on criteria approved by the Pharmacy and Therapeutics Committee, the antibiotic(s) is/are being converted to the equivalent oral dose form(s).   DESCRIPTION: These criteria include:  Patient being treated for a respiratory tract infection, urinary tract infection, cellulitis or clostridium difficile associated diarrhea if on metronidazole  The patient is not neutropenic and does not exhibit a GI malabsorption state  The patient is eating (either orally or via tube) and/or has been taking other orally administered medications for a least 24 hours  The patient is improving clinically and has a Tmax < 100.5  If you have questions about this conversion, please contact the Pharmacy Department  [x]   [phone] )  [facility] []   [phone] )  [facility]  []   [phone] )  [facility] []   [phone] )  [facility]   S. [place] Crane, PharmD

## 2014-06-20 NOTE — Progress Notes (Signed)
NURSING PROGRESS NOTE  Joseph Chen [id number] Discharge Data: 06/20/2014 6:32 PM Attending Provider: Samuella Cota, MD PCP:No primary care provider on file.   [name] to be D/C'd Home per MD order.    All IV's discontinued and monitored for bleeding.  All belongings returned to patient for patient to take home.  AVS summary and prescriptions reviewed with patient.  Patient left floor via wheelchair, escorted by NT.  Last Documented Vital Signs:  Blood pressure 108/57, pulse 56, temperature 97.9 F (36.6 C), temperature source Oral, resp. rate 20, height 5\' 8"  (1.727 m), weight 98.431 kg (217 lb), SpO2 91 %.  [name] D

## 2014-06-20 NOTE — Progress Notes (Signed)
   06/20/14 1300  Mobility  Activity Ambulate in hall  Level of Assistance Independent  Assistive Device None  Distance Ambulated (ft) 350 ft  Ambulation Response Tolerated well   O2 Saturations: Patient's O2 saturation is 92% on room air at rest. Patient's O2 saturation is 90% on room air with activity.

## 2014-06-20 NOTE — Plan of Care (Signed)
Problem: Consults Goal: Nutrition Consult-if indicated Outcome: Not Applicable Date Met:  24/58/09  Problem: Phase I Progression Outcomes Goal: Confirm chest x-ray completed Outcome: Completed/Met Date Met:  06/20/14 Goal: Consider Infectious Disease Consult Outcome: Not Applicable Date Met:  98/33/82 Goal: Consider pulmonary consult Outcome: Completed/Met Date Met:  06/20/14 Goal: Initial discharge plan identified Outcome: Completed/Met Date Met:  06/20/14 Goal: Hemodynamically stable Outcome: Completed/Met Date Met:  06/20/14 Goal: Other Phase I Outcomes/Goals Outcome: Not Applicable Date Met:  50/53/97  Problem: Phase II Progression Outcomes Goal: Other Phase II Outcomes/Goals Outcome: Not Applicable Date Met:  67/34/19  Problem: Phase III Progression Outcomes Goal: O2 sats > or equal to 93% on room air Outcome: Progressing Patient is currently 91% on room air. Goal: Pain controlled on oral analgesia Outcome: Completed/Met Date Met:  06/20/14 Goal: Activity at appropriate level-compared to baseline (UP IN CHAIR FOR HEMODIALYSIS)  Outcome: Completed/Met Date Met:  06/20/14 Goal: Tolerating diet Outcome: Completed/Met Date Met:  06/20/14 Goal: Convert IV antibiotics to PO Outcome: Completed/Met Date Met:  06/20/14 Goal: Other Phase III Outcomes/Goals Outcome: Not Applicable Date Met:  37/90/24

## 2014-06-23 LAB — CULTURE, BLOOD (ROUTINE X 2)
CULTURE: NO GROWTH
Culture: NO GROWTH

## 2014-07-01 ENCOUNTER — Encounter (HOSPITAL_COMMUNITY): Payer: Self-pay

## 2014-07-01 ENCOUNTER — Emergency Department (HOSPITAL_COMMUNITY)
Admission: EM | Admit: 2014-07-01 | Discharge: 2014-07-02 | Disposition: A | Discharge status: Home / Self Care | Payer: Self-pay | Attending: Emergency Medicine | Admitting: Emergency Medicine

## 2014-07-01 ENCOUNTER — Emergency Department (HOSPITAL_COMMUNITY): Payer: Self-pay

## 2014-07-01 DIAGNOSIS — I10 Essential (primary) hypertension: Secondary | ICD-10-CM | POA: Insufficient documentation

## 2014-07-01 DIAGNOSIS — Z8673 Personal history of transient ischemic attack (TIA), and cerebral infarction without residual deficits: Secondary | ICD-10-CM | POA: Insufficient documentation

## 2014-07-01 DIAGNOSIS — M109 Gout, unspecified: Secondary | ICD-10-CM | POA: Insufficient documentation

## 2014-07-01 DIAGNOSIS — I5020 Unspecified systolic (congestive) heart failure: Secondary | ICD-10-CM | POA: Insufficient documentation

## 2014-07-01 DIAGNOSIS — Z79899 Other long term (current) drug therapy: Secondary | ICD-10-CM | POA: Insufficient documentation

## 2014-07-01 DIAGNOSIS — R509 Fever, unspecified: Secondary | ICD-10-CM | POA: Insufficient documentation

## 2014-07-01 DIAGNOSIS — Z87891 Personal history of nicotine dependence: Secondary | ICD-10-CM | POA: Insufficient documentation

## 2014-07-01 MED ORDER — LIDOCAINE HCL (PF) 2 % IJ SOLN
INTRAMUSCULAR | Status: AC
  Administered 2014-07-02: 02:00:00
  Filled 2014-07-01: qty 10

## 2014-07-01 NOTE — ED Notes (Signed)
Edema to RLE noted.

## 2014-07-01 NOTE — ED Notes (Signed)
Rt knee pain. Seen PCP and did a white count per pt and was elevated. Sent over to verify CBC per pt d/t fever as well.

## 2014-07-01 NOTE — ED Provider Notes (Signed)
CSN: [id number]     Arrival date & time 07/01/14  1744 History  This chart was scribe for Veryl Speak, MD by Judithann Sauger, ED Scribe. The patient was seen in room APA05/APA05 and the patient's care was started at 11:08 PM.   Chief Complaint  Patient presents with  . Knee Pain   Patient is a 63 y.o. male presenting with knee pain. The history is provided by the patient. No language interpreter was used.  Knee Pain Location:  Leg and knee Injury: no   Leg location:  R leg Pain details:    Severity:  Severe   Timing:  Constant   Progression:  Worsening Foreign body present:  No foreign bodies Relieved by:  None tried Worsened by:  Nothing tried Associated symptoms: fever   Associated symptoms: no back pain    HPI Comments: Joseph Chen is a 63 y.o. male with a hx of gout who presents to the Emergency Department complaining of a gradually worsening right knee pain. He reports that he was admitted to the hospital for CHF, COPD, and pneumonia 2 weeks ago. He states that he went to [place] for a follow up check up and was sent to the ED due to his elevated WBC. He reports a fever of 103 PTA. He denies CP, SOB, N/V/D, back pain, or abdominal pain. He also denies any recent injuries.   [place].  No PCP Past Medical History  Diagnosis Date  . Gout   . Hypertension   . Stroke     TIA  . CHF (congestive heart failure)     systolic   Past Surgical History  Procedure Laterality Date  . Dental surgery    . Vasectomy    . Circumcision     No family history on file. History  Substance Use Topics  . Smoking status: Former Research scientist (life sciences)  . Smokeless tobacco: Current User    Types: Chew  . Alcohol Use: Yes    Review of Systems  Constitutional: Positive for fever. Negative for chills.  Respiratory: Negative for shortness of breath.   Cardiovascular: Negative for chest pain.  Gastrointestinal: Negative for nausea, vomiting, abdominal pain and  diarrhea.  Musculoskeletal: Positive for joint swelling (Right knee). Negative for back pain.      Allergies  Review of patient's allergies indicates no known allergies.  Home Medications   Prior to Admission medications   Medication Sig Start Date End Date Taking? Authorizing Provider  carvedilol (COREG) 25 MG tablet Take 25 mg by mouth 2 (two) times daily with a meal.    Historical Provider, MD  citalopram (CELEXA) 20 MG tablet Take 20 mg by mouth daily.    Historical Provider, MD  doxycycline (VIBRA-TABS) 100 MG tablet Take 1 tablet (100 mg total) by mouth every 12 (twelve) hours. 06/20/14   Samuella Cota, MD  furosemide (LASIX) 40 MG tablet Take 40 mg by mouth daily.    Historical Provider, MD  lisinopril (PRINIVIL,ZESTRIL) 40 MG tablet Take 40 mg by mouth 2 (two) times daily.    Historical Provider, MD  thiamine (VITAMIN B-1) 100 MG tablet Take 100 mg by mouth daily.    Historical Provider, MD   BP 136/58 mmHg  Pulse 82  Temp(Src) 100.1 F (37.8 C) (Oral)  Resp 20  Ht 5\' 8"  (1.727 m)  Wt 215 lb (97.523 kg)  BMI 32.70 kg/m2  SpO2 97% Physical Exam  Constitutional: He is oriented to person, place, and time. He  appears well-developed and well-nourished. No distress.  HENT:  Head: Normocephalic and atraumatic.  Eyes: Conjunctivae and EOM are normal.  Neck: Neck supple. No tracheal deviation present.  Cardiovascular: Normal rate, regular rhythm and normal heart sounds.   Pulmonary/Chest: Effort normal and breath sounds normal. No respiratory distress.  Abdominal: Soft. Bowel sounds are normal. There is no tenderness.  Musculoskeletal: Normal range of motion.  Right knee has a moderate size effusion and is warm to the touch. There is pain with ROM.   Neurological: He is alert and oriented to person, place, and time.  Skin: Skin is warm and dry.  Psychiatric: He has a normal mood and affect. His behavior is normal.  Nursing note and vitals reviewed.   ED Course   Procedures (including critical care time) DIAGNOSTIC STUDIES: Oxygen Saturation is 97% on RA, normal by my interpretation.    COORDINATION OF CARE: 11:19 PM- Pt advised of plan for treatment and pt agrees.    Labs Review Labs Reviewed - No data to display  Imaging Review No results found.   EKG Interpretation None      MDM   Final diagnoses:  None    Patient is a 63 year old male who presents with complaints of right knee pain, fever. He was seen by his primary doctor today and was sent here for further evaluation as his white cell count was 21,000. He reports low-grade fevers at home but denies chills. He was recently hospitalized for pneumonia, CHF, and COPD. Workup reveals a white count of 16,000. An arthrocentesis of the right knee reveals crystals suggestive of gout.  He has no other symptoms that would explain his low-grade fever. The fever may well be related to gout. He will be treated with indomethacin, pain meds, rest, and when necessary return.   I personally performed the services described in this documentation, which was scribed in my presence. The recorded information has been reviewed and is accurate.     Veryl Speak, MD 07/02/14 [phone]

## 2014-07-02 LAB — CBC WITH DIFFERENTIAL/PLATELET
BASOS ABS: 0 10*3/uL (ref 0.0–0.1)
Basophils Relative: 0 % (ref 0–1)
Eosinophils Absolute: 0 10*3/uL (ref 0.0–0.7)
Eosinophils Relative: 0 % (ref 0–5)
HEMATOCRIT: 32.1 % — AB (ref 39.0–52.0)
Hemoglobin: 10.3 g/dL — ABNORMAL LOW (ref 13.0–17.0)
Lymphocytes Relative: 9 % — ABNORMAL LOW (ref 12–46)
Lymphs Abs: 1.5 10*3/uL (ref 0.7–4.0)
MCH: 30.7 pg (ref 26.0–34.0)
MCHC: 32.1 g/dL (ref 30.0–36.0)
MCV: 95.8 fL (ref 78.0–100.0)
MONO ABS: 1.6 10*3/uL — AB (ref 0.1–1.0)
Monocytes Relative: 10 % (ref 3–12)
NEUTROS ABS: 12.8 10*3/uL — AB (ref 1.7–7.7)
NEUTROS PCT: 81 % — AB (ref 43–77)
PLATELETS: 220 10*3/uL (ref 150–400)
RBC: 3.35 MIL/uL — ABNORMAL LOW (ref 4.22–5.81)
RDW: 17.5 % — AB (ref 11.5–15.5)
WBC: 15.9 10*3/uL — AB (ref 4.0–10.5)

## 2014-07-02 LAB — URINALYSIS, ROUTINE W REFLEX MICROSCOPIC
Glucose, UA: NEGATIVE mg/dL
KETONES UR: NEGATIVE mg/dL
Leukocytes, UA: NEGATIVE
Nitrite: NEGATIVE
Protein, ur: 30 mg/dL — AB
Specific Gravity, Urine: 1.025 (ref 1.005–1.030)
Urobilinogen, UA: 1 mg/dL (ref 0.0–1.0)
pH: 6 (ref 5.0–8.0)

## 2014-07-02 LAB — COMPREHENSIVE METABOLIC PANEL
ALBUMIN: 2.9 g/dL — AB (ref 3.5–5.2)
ALT: 5 U/L (ref 0–53)
AST: 12 U/L (ref 0–37)
Alkaline Phosphatase: 51 U/L (ref 39–117)
Anion gap: 12 (ref 5–15)
BILIRUBIN TOTAL: 0.7 mg/dL (ref 0.3–1.2)
BUN: 15 mg/dL (ref 6–23)
CHLORIDE: 95 meq/L — AB (ref 96–112)
CO2: 30 mEq/L (ref 19–32)
Calcium: 9 mg/dL (ref 8.4–10.5)
Creatinine, Ser: 1.03 mg/dL (ref 0.50–1.35)
GFR calc non Af Amer: 75 mL/min — ABNORMAL LOW (ref >90)
GFR, EST AFRICAN AMERICAN: 87 mL/min — AB (ref >90)
Glucose, Bld: 114 mg/dL — ABNORMAL HIGH (ref 70–99)
Potassium: 3 mEq/L — ABNORMAL LOW (ref 3.7–5.3)
Sodium: 137 mEq/L (ref 137–147)
Total Protein: 7.1 g/dL (ref 6.0–8.3)

## 2014-07-02 LAB — SYNOVIAL CELL COUNT + DIFF, W/ CRYSTALS

## 2014-07-02 LAB — PRO B NATRIURETIC PEPTIDE: Pro B Natriuretic peptide (BNP): 4374 pg/mL — ABNORMAL HIGH (ref 0–125)

## 2014-07-02 LAB — URINE MICROSCOPIC-ADD ON

## 2014-07-02 MED ORDER — INDOMETHACIN 25 MG PO CAPS: 25.0000 mg | ORAL_CAPSULE | Freq: Three times a day (TID) | ORAL | Status: DC | PRN

## 2014-07-02 MED ORDER — OXYCODONE-ACETAMINOPHEN 5-325 MG PO TABS
1.0000 | ORAL_TABLET | Freq: Four times a day (QID) | ORAL | Status: DC | PRN
Start: 2014-07-02 — End: 2015-09-18

## 2014-07-02 MED ORDER — METHYLPREDNISOLONE SODIUM SUCC 125 MG IJ SOLR
INTRAMUSCULAR | Status: AC
  Administered 2014-07-02: 125 mg
  Filled 2014-07-02: qty 2

## 2014-07-02 MED ORDER — MORPHINE SULFATE 4 MG/ML IJ SOLN
INTRAMUSCULAR | Status: AC
  Administered 2014-07-02: 4 mg
  Filled 2014-07-02: qty 1

## 2014-07-02 MED ORDER — PREDNISONE 10 MG PO TABS: 20.0000 mg | ORAL_TABLET | Freq: Two times a day (BID) | ORAL | Status: DC

## 2014-07-02 NOTE — Discharge Instructions (Signed)
Indomethacin as prescribed.  Percocet as prescribed as needed for pain.  Turn to the emergency department if your symptoms substantially worsen or change.  We will call you if your cultures indicate that you require further treatment.   Gout Gout is an inflammatory arthritis caused by a buildup of uric acid crystals in the joints. Uric acid is a chemical that is normally present in the blood. When the level of uric acid in the blood is too high it can form crystals that deposit in your joints and tissues. This causes joint redness, soreness, and swelling (inflammation). Repeat attacks are common. Over time, uric acid crystals can form into masses (tophi) near a joint, destroying bone and causing disfigurement. Gout is treatable and often preventable. CAUSES  The disease begins with elevated levels of uric acid in the blood. Uric acid is produced by your body when it breaks down a naturally found substance called purines. Certain foods you eat, such as meats and fish, contain high amounts of purines. Causes of an elevated uric acid level include:  Being passed down from parent to child (heredity).  Diseases that cause increased uric acid production (such as obesity, psoriasis, and certain cancers).  Excessive alcohol use.  Diet, especially diets rich in meat and seafood.  Medicines, including certain cancer-fighting medicines (chemotherapy), water pills (diuretics), and aspirin.  Chronic kidney disease. The kidneys are no longer able to remove uric acid well.  Problems with metabolism. Conditions strongly associated with gout include:  Obesity.  High blood pressure.  High cholesterol.  Diabetes. Not everyone with elevated uric acid levels gets gout. It is not understood why some people get gout and others do not. Surgery, joint injury, and eating too much of certain foods are some of the factors that can lead to gout attacks. SYMPTOMS   An attack of gout comes on quickly. It  causes intense pain with redness, swelling, and warmth in a joint.  Fever can occur.  Often, only one joint is involved. Certain joints are more commonly involved:  Base of the big toe.  Knee.  Ankle.  Wrist.  Finger. Without treatment, an attack usually goes away in a few days to weeks. Between attacks, you usually will not have symptoms, which is different from many other forms of arthritis. DIAGNOSIS  Your caregiver will suspect gout based on your symptoms and exam. In some cases, tests may be recommended. The tests may include:  Blood tests.  Urine tests.  X-rays.  Joint fluid exam. This exam requires a needle to remove fluid from the joint (arthrocentesis). Using a microscope, gout is confirmed when uric acid crystals are seen in the joint fluid. TREATMENT  There are two phases to gout treatment: treating the sudden onset (acute) attack and preventing attacks (prophylaxis).  Treatment of an Acute Attack.  Medicines are used. These include anti-inflammatory medicines or steroid medicines.  An injection of steroid medicine into the affected joint is sometimes necessary.  The painful joint is rested. Movement can worsen the arthritis.  You may use warm or cold treatments on painful joints, depending which works best for you.  Treatment to Prevent Attacks.  If you suffer from frequent gout attacks, your caregiver may advise preventive medicine. These medicines are started after the acute attack subsides. These medicines either help your kidneys eliminate uric acid from your body or decrease your uric acid production. You may need to stay on these medicines for a very long time.  The early phase of treatment with  preventive medicine can be associated with an increase in acute gout attacks. For this reason, during the first few months of treatment, your caregiver may also advise you to take medicines usually used for acute gout treatment. Be sure you understand your  caregiver's directions. Your caregiver may make several adjustments to your medicine dose before these medicines are effective.  Discuss dietary treatment with your caregiver or dietitian. Alcohol and drinks high in sugar and fructose and foods such as meat, poultry, and seafood can increase uric acid levels. Your caregiver or dietitian can advise you on drinks and foods that should be limited. HOME CARE INSTRUCTIONS   Do not take aspirin to relieve pain. This raises uric acid levels.  Only take over-the-counter or prescription medicines for pain, discomfort, or fever as directed by your caregiver.  Rest the joint as much as possible. When in bed, keep sheets and blankets off painful areas.  Keep the affected joint raised (elevated).  Apply warm or cold treatments to painful joints. Use of warm or cold treatments depends on which works best for you.  Use crutches if the painful joint is in your leg.  Drink enough fluids to keep your urine clear or pale yellow. This helps your body get rid of uric acid. Limit alcohol, sugary drinks, and fructose drinks.  Follow your dietary instructions. Pay careful attention to the amount of protein you eat. Your daily diet should emphasize fruits, vegetables, whole grains, and fat-free or low-fat milk products. Discuss the use of coffee, vitamin C, and cherries with your caregiver or dietitian. These may be helpful in lowering uric acid levels.  Maintain a healthy body weight. SEEK MEDICAL CARE IF:   You develop diarrhea, vomiting, or any side effects from medicines.  You do not feel better in 24 hours, or you are getting worse. SEEK IMMEDIATE MEDICAL CARE IF:   Your joint becomes suddenly more tender, and you have chills or a fever. MAKE SURE YOU:   Understand these instructions.  Will watch your condition.  Will get help right away if you are not doing well or get worse. Document Released: 07/23/2000 Document Revised: 12/10/2013 Document  Reviewed: 03/08/2012 [facility] Patient Information 2015 [place], [place]. This information is not intended to replace advice given to you by your health care provider. Make sure you discuss any questions you have with your health care provider.

## 2014-07-03 LAB — URINE CULTURE
CULTURE: NO GROWTH
Colony Count: NO GROWTH

## 2014-07-05 LAB — BODY FLUID CULTURE: CULTURE: NO GROWTH

## 2014-07-07 LAB — CULTURE, BLOOD (ROUTINE X 2)
CULTURE: NO GROWTH
CULTURE: NO GROWTH

## 2015-08-30 IMAGING — CR DG CHEST 2V
2 series · 2 of 2 positions shown · non-contrast
Comparison: 06/18/2014

CLINICAL DATA: Knee pain.  Fever and weakness.

EXAM:
CHEST  2 VIEW

[view not recorded (1 of 2)]
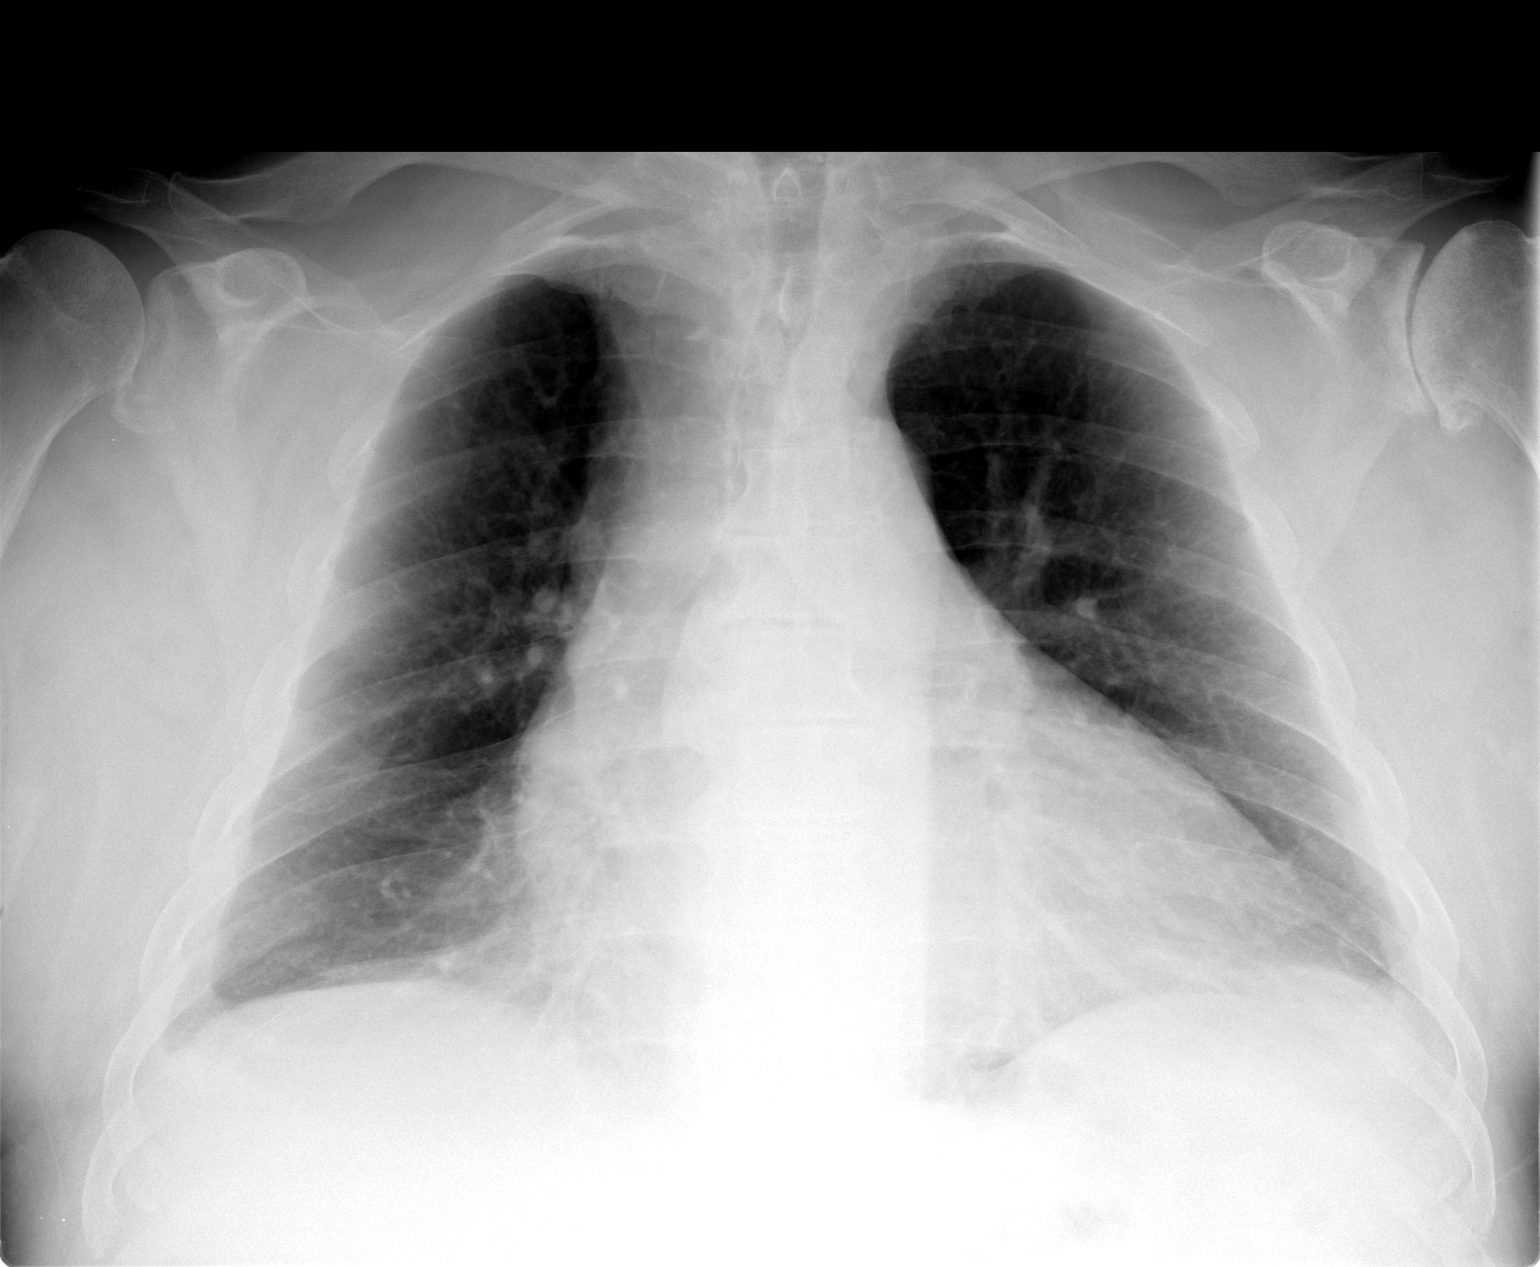

[view not recorded (2 of 2)]
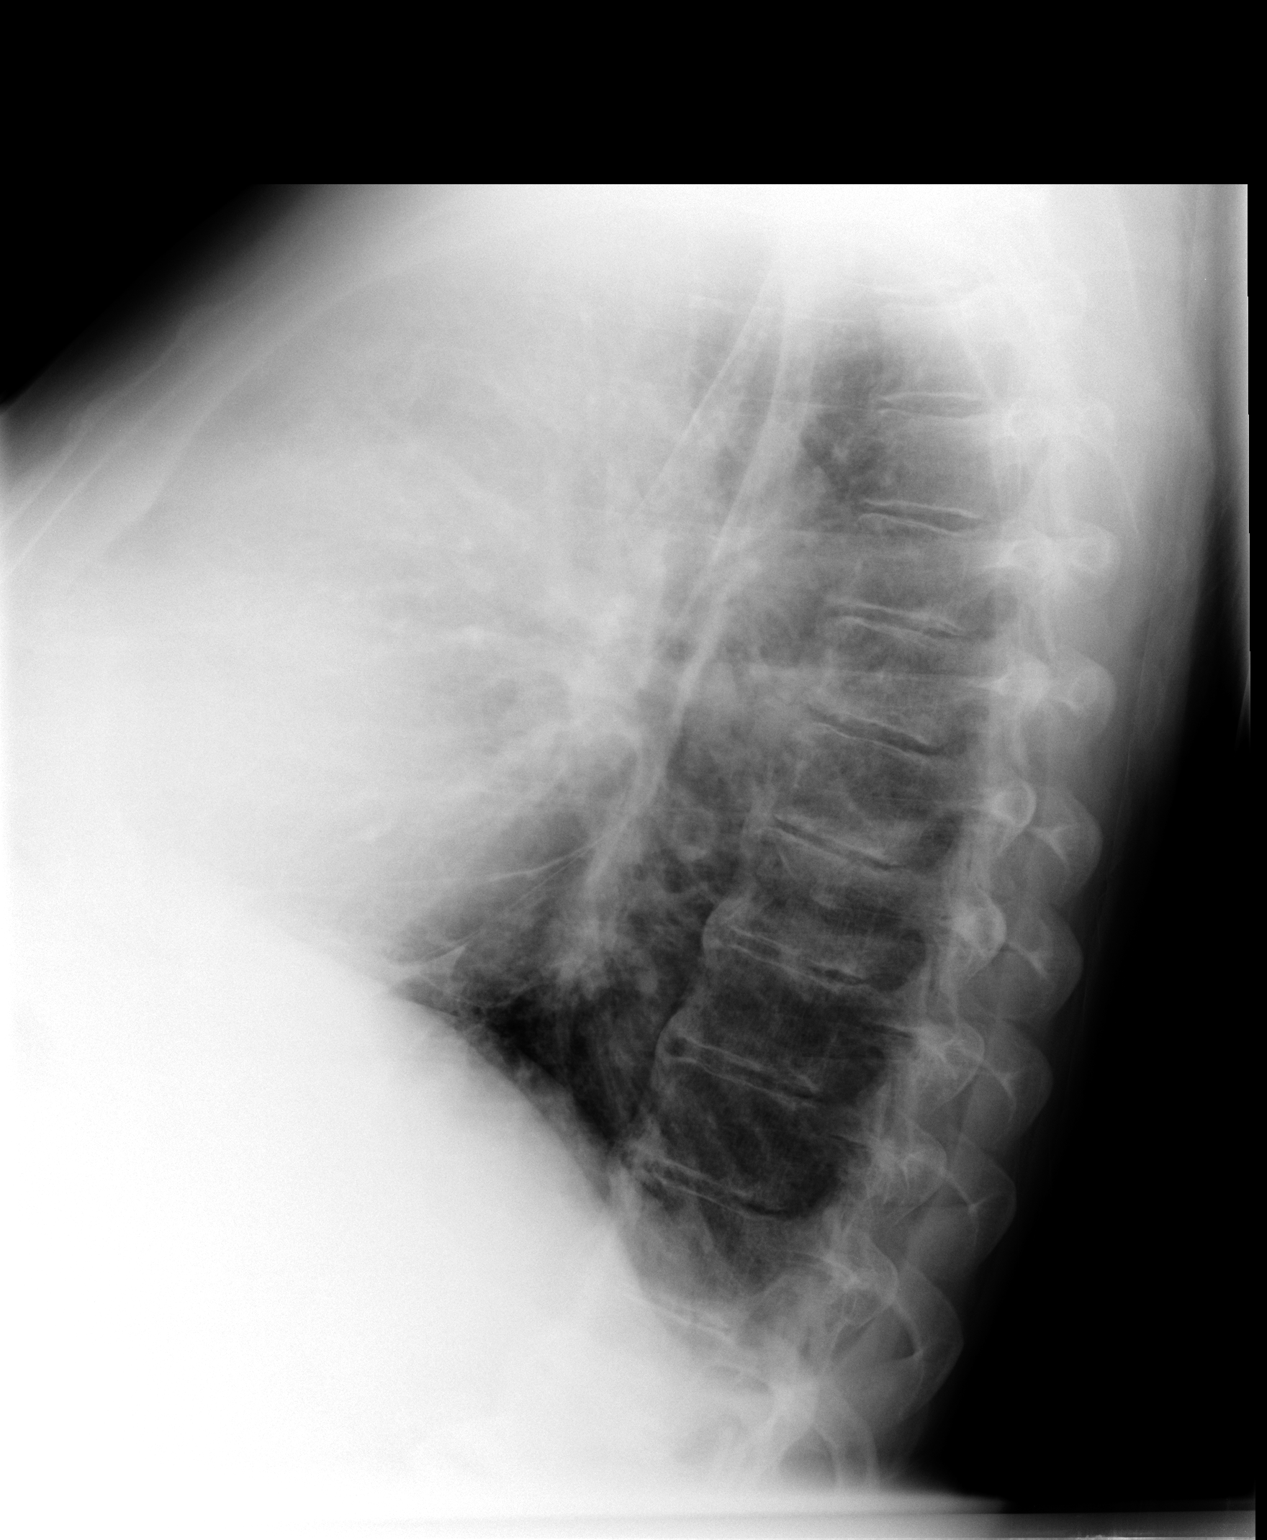

[2 of 2 positions shown; findings below may reference images not displayed]

FINDINGS: Unchanged cardiomegaly. Negative aortic contours. There is
transverse narrowing of the trachea which may reflect COPD.
Unchanged small bilateral pleural effusions. No pulmonary edema or
focal pneumonia. No pneumothorax. Advanced left glenohumeral
osteoarthritis.
IMPRESSION: Cardiomegaly and small bilateral pleural effusions.

## 2015-09-17 ENCOUNTER — Encounter: Payer: Self-pay | Admitting: *Deleted

## 2015-09-18 ENCOUNTER — Ambulatory Visit (INDEPENDENT_AMBULATORY_CARE_PROVIDER_SITE_OTHER): Payer: Medicare HMO | Admitting: Cardiology

## 2015-09-18 ENCOUNTER — Encounter: Payer: Self-pay | Admitting: Cardiology

## 2015-09-18 VITALS — BP 118/88 | HR 75 | Ht 68.0 in | Wt 221.0 lb

## 2015-09-18 DIAGNOSIS — I5032 Chronic diastolic (congestive) heart failure: Secondary | ICD-10-CM | POA: Diagnosis not present

## 2015-09-18 DIAGNOSIS — I272 Other secondary pulmonary hypertension: Secondary | ICD-10-CM | POA: Diagnosis not present

## 2015-09-18 DIAGNOSIS — R0602 Shortness of breath: Secondary | ICD-10-CM

## 2015-09-18 DIAGNOSIS — I10 Essential (primary) hypertension: Secondary | ICD-10-CM | POA: Diagnosis not present

## 2015-09-18 DIAGNOSIS — IMO0002 Reserved for concepts with insufficient information to code with codable children: Secondary | ICD-10-CM

## 2015-09-18 DIAGNOSIS — I351 Nonrheumatic aortic (valve) insufficiency: Secondary | ICD-10-CM

## 2015-09-18 DIAGNOSIS — J449 Chronic obstructive pulmonary disease, unspecified: Secondary | ICD-10-CM

## 2015-09-18 NOTE — Progress Notes (Signed)
Cardiology Office Note  Date: 09/18/2015   ID: Joseph Chen, DOB [birth year], MRN [id number]  PCP: Antionette Fairy, PA-C  Consulting Cardiologist: Rozann Lesches, MD   Chief Complaint  Patient presents with  . Cardiac evaluation  . History of heart failure    History of Present Illness: Joseph Chen is a 65 y.o. male referred for cardiology consultation by Ms. Neysa Hotter PA-C with [facility]. I reviewed his available records and updated the chart. He has a diagnosis of congestive heart failure and also COPD, reports a fairly long-standing history of dyspnea on exertion that is generally in the range of NYHA class 2-3. He does report intermittent wheezing, but does not use inhalers. He does not report any exertional chest pain or palpitations. At times he experiences intermittent mild leg edema, but states that this has not been much of a problem recently. He gets a cramping discomfort in his upper abdomen and lower chest when he bends over sometimes. He is not having any orthopnea or PND.  He had an echocardiogram back in 2015 showing LVEF of 50-55% with grade 2 diastolic dysfunction. In addition he had moderate aortic regurgitation, severe left atrial enlargement, and mildly dilated right ventricle with evidence of severe pulmonary hypertension, PASP 78 mmHg. he has not had any follow-up testing.  As far as his diagnosis of COPD, he does have a prior history of tobacco use, but not for 20 years. I am not aware that he has had any PFTs.  We reviewed his medications. He reports compliance and is on Coreg, Lasix, and lisinopril. His blood pressure today is well-controlled.  I reviewed his ECG today which shows normal sinus rhythm with borderline low voltage in the precordial leads and nonspecific T-wave changes.  Past Medical History  Diagnosis Date  . Gout   . Essential hypertension   . Diastolic dysfunction     Grade 2, LVEF 50-55% 2015  . COPD with  emphysema ([place])   . History of pneumonia   . Secondary pulmonary hypertension (HCC)     PASP 78 mmHg 2015    Past Surgical History  Procedure Laterality Date  . Dental surgery    . Vasectomy    . Circumcision      Current Outpatient Prescriptions  Medication Sig Dispense Refill  . allopurinol (ZYLOPRIM) 300 MG tablet Take 300 mg by mouth daily.    . carvedilol (COREG) 25 MG tablet Take 25 mg by mouth 2 (two) times daily with a meal.    . colchicine 0.6 MG tablet Take 0.6 mg by mouth as needed.    . furosemide (LASIX) 40 MG tablet Take 40 mg by mouth daily.    Marland Kitchen lisinopril (PRINIVIL,ZESTRIL) 40 MG tablet Take 40 mg by mouth 2 (two) times daily.     No current facility-administered medications for this visit.   Allergies:  Review of patient's allergies indicates no known allergies.   Social History: The patient  reports that he quit smoking about 27 years ago. His smoking use included Cigarettes. His smokeless tobacco use includes Chew. He reports that he drinks alcohol. He reports that he does not use illicit drugs.   Family History: The patient's family history includes Aneurysm in his mother; Asthma in his brother; Rheumatic fever in his father; Stroke in his mother.   ROS:  Please see the history of present illness. Otherwise, complete review of systems is positive for arthritis pains and gout flares.  All other systems are  reviewed and negative.   Physical Exam: VS:  BP 118/88 mmHg  Pulse 75  Ht 5\' 8"  (1.727 m)  Wt 221 lb (100.245 kg)  BMI 33.61 kg/m2  SpO2 98%, BMI Body mass index is 33.61 kg/(m^2).  Wt Readings from Last 3 Encounters:  09/18/15 221 lb (100.245 kg)  07/01/14 215 lb (97.523 kg)  06/20/14 217 lb (98.431 kg)    General: Obese male, appears comfortable at rest. HEENT: Conjunctiva and lids normal, oropharynx clear. Neck: Supple, no elevated JVP or carotid bruits, no thyromegaly. Lungs: Decreased breath sounds without wheezing, nonlabored breathing at  rest. Cardiac: Regular rate and rhythm, no S3, soft systolic murmur, no pericardial rub. Abdomen: Protuberant, nontender, bowel sounds present, no guarding or rebound. Extremities: No pitting edema, distal pulses 1-2+. Skin: Warm and dry. Musculoskeletal: No kyphosis. Neuropsychiatric: Alert and oriented x3, affect grossly appropriate.  ECG: I personally reviewed the previous tracing from 06/18/2014 which showed sinus rhythm with nonspecific T-wave changes and prolonged QT interval.  Recent Labwork:  October 2016: Cholesterol 162, triglycerides 96, HDL 48, LDL 95, BUN 27, creatinine 1.3, sodium 141, potassium 4.1  Other Studies Reviewed Today:  Echocardiogram 06/19/2014: Study Conclusions  - Left ventricle: The cavity size was normal. Wall thickness was increased in a pattern of mild LVH. Systolic function was normal. The estimated ejection fraction was in the range of 50% to 55%. Wall motion was normal; there were no regional wall motion abnormalities. Features are consistent with a pseudonormal left ventricular filling pattern, with concomitant abnormal relaxation and increased filling pressure (grade 2 diastolic dysfunction). - Aortic valve: Mildly calcified annulus. Mildly thickened leaflets. There was moderate regurgitation. The AR vena contracta is 0.4 cm. Regurgitation pressure half-time: 420 ms. - Aorta: Aortic root dimension: 36 mm (ED). - Aortic root: The aortic root was normal in size. - Mitral valve: Mildly calcified annulus. Mildly thickened leaflets - Left atrium: The atrium was severely dilated. - Right ventricle: The cavity size was mildly dilated. - Right atrium: The atrium was moderately dilated. - Pulmonary arteries: Systolic pressure was severely increased. PA peak pressure: 78 mm Hg (S). - Technically adequate study.  Assessment and Plan:  1. History of congestive heart failure. This was characterized as systolic dysfunction based on  records, however it looks like he actually has a history of diastolic heart failure. He is on a reasonable medical regimen and reports NYHA class 2-3 dyspnea which has been chronic in nature and perhaps also contributed to by COPD and pulmonary hypertension. Plan at this time is to obtain a follow-up echocardiogram to reassess cardiac structure and function which will help to guide further treatment options. We can determine his follow-up from there as well.  2. Mild RV dilatation with evidence of severe pulmonary hypertension based on echocardiogram from 2015 as outlined above. His could be related to COPD, although I am not sure how objectively this diagnosis was evaluated in the past. I would suggest that he have PFTs ordered by PCP, and if significantly abnormal be referred to a pulmonary specialist. We will follow-up on his echocardiogram to reassess pulmonary pressures.  3. Moderate aortic regurgitation by echocardiogram in 2015. This will also be reassessed by echocardiography. Not entirely clear that this symptom provoking however.  4. Essential hypertension, blood pressure is well controlled today. I have not changed any of his medications as yet.  Current medicines were reviewed with the patient today.   Orders Placed This Encounter  Procedures  . EKG 12-Lead  . ECHOCARDIOGRAM  COMPLETE    Disposition: FU echocardiogram and determine disposition.   Signed, Satira Sark, MD, [facility] 09/18/2015 10:30 AM    [facility] [address] [place], [place], [place] [address] Phone: [phone]; Fax: [phone]

## 2015-09-18 NOTE — Patient Instructions (Signed)
Your physician recommends that you continue on your current medications as directed. Please refer to the Current Medication list given to you today. Your physician has requested that you have an echocardiogram. Echocardiography is a painless test that uses sound waves to create images of your heart. It provides your doctor with information about the size and shape of your heart and how well your heart's chambers and valves are working. This procedure takes approximately one hour. There are no restrictions for this procedure. We will call you with your results. Your follow up appointment will be pending your echo results.

## 2015-09-24 ENCOUNTER — Other Ambulatory Visit: Payer: Self-pay

## 2015-09-24 ENCOUNTER — Ambulatory Visit (INDEPENDENT_AMBULATORY_CARE_PROVIDER_SITE_OTHER): Payer: Medicare HMO

## 2015-09-24 DIAGNOSIS — I272 Other secondary pulmonary hypertension: Secondary | ICD-10-CM | POA: Diagnosis not present

## 2015-09-24 DIAGNOSIS — IMO0002 Reserved for concepts with insufficient information to code with codable children: Secondary | ICD-10-CM

## 2015-09-24 DIAGNOSIS — R0602 Shortness of breath: Secondary | ICD-10-CM

## 2015-09-25 ENCOUNTER — Telehealth: Payer: Self-pay | Admitting: *Deleted

## 2015-09-25 NOTE — Telephone Encounter (Signed)
Patient returned call

## 2015-09-25 NOTE — Telephone Encounter (Signed)
-----   Message from Satira Sark, MD sent at 09/24/2015  2:52 PM EST ----- Reviewed report. He has normal LVEF and mild diastolic dysfunction. This would be more consistent with what we discussed at his recent office visit at which time he was fairly stable on medical therapy. Importantly, he no longer has evidence of significant pulmonary hypertension which had been found in the past, PASP only mildly increased and likely related to his COPD. He should keep follow-up with PCP, and we can see him back as needed. Send copy of this to PCP.

## 2015-09-26 NOTE — Telephone Encounter (Signed)
Patient informed and copy sent to PCP. 

## 2016-02-06 ENCOUNTER — Encounter (INDEPENDENT_AMBULATORY_CARE_PROVIDER_SITE_OTHER): Payer: Self-pay | Admitting: Internal Medicine

## 2016-03-01 ENCOUNTER — Encounter (INDEPENDENT_AMBULATORY_CARE_PROVIDER_SITE_OTHER): Payer: Self-pay | Admitting: Internal Medicine

## 2016-03-01 ENCOUNTER — Other Ambulatory Visit (INDEPENDENT_AMBULATORY_CARE_PROVIDER_SITE_OTHER): Payer: Self-pay | Admitting: Internal Medicine

## 2016-03-01 ENCOUNTER — Ambulatory Visit (INDEPENDENT_AMBULATORY_CARE_PROVIDER_SITE_OTHER): Payer: Medicare HMO | Admitting: Internal Medicine

## 2016-03-01 ENCOUNTER — Encounter (INDEPENDENT_AMBULATORY_CARE_PROVIDER_SITE_OTHER): Payer: Self-pay | Admitting: *Deleted

## 2016-03-01 ENCOUNTER — Telehealth (INDEPENDENT_AMBULATORY_CARE_PROVIDER_SITE_OTHER): Payer: Self-pay | Admitting: *Deleted

## 2016-03-01 VITALS — BP 132/80 | HR 72 | Temp 98.5°F | Ht 68.25 in | Wt 226.2 lb

## 2016-03-01 DIAGNOSIS — R195 Other fecal abnormalities: Secondary | ICD-10-CM | POA: Insufficient documentation

## 2016-03-01 LAB — CBC WITH DIFFERENTIAL/PLATELET
Basophils Absolute: 154 {cells}/uL (ref 0–200)
Basophils Relative: 1 %
Eosinophils Absolute: 154 {cells}/uL (ref 15–500)
Eosinophils Relative: 1 %
HCT: 36.6 % — ABNORMAL LOW (ref 38.5–50.0)
Hemoglobin: 12.3 g/dL — ABNORMAL LOW (ref 13.2–17.1)
Lymphocytes Relative: 12 %
Lymphs Abs: 1848 {cells}/uL (ref 850–3900)
MCH: 36 pg — ABNORMAL HIGH (ref 27.0–33.0)
MCHC: 33.6 g/dL (ref 32.0–36.0)
MCV: 107 fL — ABNORMAL HIGH (ref 80.0–100.0)
MPV: 9 fL (ref 7.5–12.5)
Monocytes Absolute: 1232 {cells}/uL — ABNORMAL HIGH (ref 200–950)
Monocytes Relative: 8 %
Neutro Abs: 12012 {cells}/uL — ABNORMAL HIGH (ref 1500–7800)
Neutrophils Relative %: 78 %
Platelets: 266 K/uL (ref 140–400)
RBC: 3.42 MIL/uL — ABNORMAL LOW (ref 4.20–5.80)
RDW: 15.5 % — ABNORMAL HIGH (ref 11.0–15.0)
WBC: 15.4 K/uL — ABNORMAL HIGH (ref 3.8–10.8)

## 2016-03-01 MED ORDER — PEG 3350-KCL-NA BICARB-NACL 420 G PO SOLR: 4000.0000 mL | Freq: Once | ORAL | 0 refills | Status: AC

## 2016-03-01 NOTE — Progress Notes (Signed)
   Subjective:    Patient ID: Joseph Chen, male    DOB: [birth year], 65 y.o.   MRN: [id number]  HPI Referred by [facility] for positive fecal occult blood card/colonoscopy. Patient declined colonoscopy at [facility] in June of this year. He tells his BMs are normal. He has a BM x 2 a day. No melena or BRRB. Appetite is good. No weight loss. He has actully gained weight.  He denies any GI problems at this time.  No dysphagia. No GERD.  Has never had a colonoscopy in the past. No family hx of colon cancer that he is aware of.    Review of Systems Past Medical History:  Diagnosis Date  . COPD with emphysema ([place])   . Diastolic dysfunction    Grade 2, LVEF 50-55% 2015  . Essential hypertension   . Gout   . History of pneumonia   . Secondary pulmonary hypertension (HCC)    PASP 78 mmHg 2015    Past Surgical History:  Procedure Laterality Date  . CIRCUMCISION    . dental extractions    . DENTAL SURGERY    . VASECTOMY      No Known Allergies  Current Outpatient Prescriptions on File Prior to Visit  Medication Sig Dispense Refill  . allopurinol (ZYLOPRIM) 300 MG tablet Take 300 mg by mouth daily.    . carvedilol (COREG) 25 MG tablet Take 25 mg by mouth 2 (two) times daily with a meal.    . furosemide (LASIX) 40 MG tablet Take 40 mg by mouth daily.    Marland Kitchen lisinopril (PRINIVIL,ZESTRIL) 40 MG tablet Take 40 mg by mouth 2 (two) times daily.    . colchicine 0.6 MG tablet Take 0.6 mg by mouth as needed.     No current facility-administered medications on file prior to visit.        Objective:   Physical Exam Blood pressure 132/80, pulse 72, temperature 98.5 F (36.9 C), height 5' 8.25" (1.734 m), weight 226 lb 3.2 oz (102.6 kg). Alert and oriented. Skin warm and dry. Oral mucosa is moist.   . Sclera anicteric, conjunctivae is pink. Thyroid not enlarged. No cervical lymphadenopathy. Lungs clear. Heart regular rate and rhythm.  Abdomen is soft.  Bowel sounds are positive. No hepatomegaly. No abdominal masses felt. No tenderness.  No edema to lower extremities.          Assessment & Plan:  Heme positive stool card. Colonic neoplasm, polp, AVM needs to be ruled out. Colonoscopy. The risks and benefits such as perforation, bleeding, and infection were reviewed with the patient and is agreeable.

## 2016-03-01 NOTE — Telephone Encounter (Signed)
Patient needs trilyte 

## 2016-03-01 NOTE — Patient Instructions (Signed)
The risks and benefits such as perforation, bleeding, and infection were reviewed with the patient and is agreeable. 

## 2016-03-02 MED ORDER — PEG 3350-KCL-NA BICARB-NACL 420 G PO SOLR: 4000.0000 mL | Freq: Once | ORAL | 0 refills | Status: AC

## 2016-04-21 ENCOUNTER — Encounter (HOSPITAL_COMMUNITY): Payer: Self-pay | Admitting: *Deleted

## 2016-04-21 ENCOUNTER — Ambulatory Visit (HOSPITAL_COMMUNITY)
Admission: RE | Admit: 2016-04-21 | Discharge: 2016-04-21 | Disposition: A | Discharge status: Home / Self Care | Payer: Medicare HMO | Source: Ambulatory Visit | Attending: Internal Medicine | Admitting: Internal Medicine

## 2016-04-21 ENCOUNTER — Encounter (HOSPITAL_COMMUNITY)
Admission: RE | Disposition: A | Discharge status: Home / Self Care | Payer: Self-pay | Source: Ambulatory Visit | Attending: Internal Medicine

## 2016-04-21 DIAGNOSIS — Z87891 Personal history of nicotine dependence: Secondary | ICD-10-CM | POA: Diagnosis not present

## 2016-04-21 DIAGNOSIS — K5730 Diverticulosis of large intestine without perforation or abscess without bleeding: Secondary | ICD-10-CM | POA: Diagnosis not present

## 2016-04-21 DIAGNOSIS — Z79899 Other long term (current) drug therapy: Secondary | ICD-10-CM | POA: Diagnosis not present

## 2016-04-21 DIAGNOSIS — J449 Chronic obstructive pulmonary disease, unspecified: Secondary | ICD-10-CM | POA: Diagnosis not present

## 2016-04-21 DIAGNOSIS — K644 Residual hemorrhoidal skin tags: Secondary | ICD-10-CM | POA: Insufficient documentation

## 2016-04-21 DIAGNOSIS — M1990 Unspecified osteoarthritis, unspecified site: Secondary | ICD-10-CM | POA: Insufficient documentation

## 2016-04-21 DIAGNOSIS — D122 Benign neoplasm of ascending colon: Secondary | ICD-10-CM | POA: Insufficient documentation

## 2016-04-21 DIAGNOSIS — M109 Gout, unspecified: Secondary | ICD-10-CM | POA: Diagnosis not present

## 2016-04-21 DIAGNOSIS — R195 Other fecal abnormalities: Secondary | ICD-10-CM | POA: Insufficient documentation

## 2016-04-21 DIAGNOSIS — D125 Benign neoplasm of sigmoid colon: Secondary | ICD-10-CM | POA: Insufficient documentation

## 2016-04-21 DIAGNOSIS — I10 Essential (primary) hypertension: Secondary | ICD-10-CM | POA: Insufficient documentation

## 2016-04-21 DIAGNOSIS — D649 Anemia, unspecified: Secondary | ICD-10-CM | POA: Insufficient documentation

## 2016-04-21 DIAGNOSIS — D539 Nutritional anemia, unspecified: Secondary | ICD-10-CM

## 2016-04-21 HISTORY — DX: Unspecified osteoarthritis, unspecified site: M19.90

## 2016-04-21 HISTORY — PX: COLONOSCOPY: SHX5424

## 2016-04-21 LAB — HEMOGLOBIN AND HEMATOCRIT, BLOOD
HEMATOCRIT: 36.3 % — AB (ref 39.0–52.0)
Hemoglobin: 11.9 g/dL — ABNORMAL LOW (ref 13.0–17.0)

## 2016-04-21 LAB — VITAMIN B12: Vitamin B-12: 133 pg/mL — ABNORMAL LOW (ref 180–914)

## 2016-04-21 SURGERY — COLONOSCOPY
Anesthesia: Moderate Sedation

## 2016-04-21 MED ORDER — MEPERIDINE HCL 50 MG/ML IJ SOLN
INTRAMUSCULAR | Status: AC
  Filled 2016-04-21: qty 1

## 2016-04-21 MED ORDER — SODIUM CHLORIDE 0.9 % IV SOLN
INTRAVENOUS | Status: DC
  Administered 2016-04-21: 13:00:00 via INTRAVENOUS

## 2016-04-21 MED ORDER — STERILE WATER FOR IRRIGATION IR SOLN
Status: DC | PRN
  Administered 2016-04-21: 15:00:00

## 2016-04-21 MED ORDER — MEPERIDINE HCL 50 MG/ML IJ SOLN
INTRAMUSCULAR | Status: DC | PRN
  Administered 2016-04-21 (×2): 25 mg via INTRAVENOUS

## 2016-04-21 MED ORDER — MIDAZOLAM HCL 5 MG/5ML IJ SOLN
INTRAMUSCULAR | Status: AC
  Filled 2016-04-21: qty 10

## 2016-04-21 MED ORDER — MIDAZOLAM HCL 5 MG/5ML IJ SOLN
INTRAMUSCULAR | Status: DC | PRN
  Administered 2016-04-21 (×3): 2 mg via INTRAVENOUS

## 2016-04-21 MED ORDER — PREDNISONE 10 MG PO TABS: 30.0000 mg | ORAL_TABLET | Freq: Every day | ORAL | 0 refills | Status: DC

## 2016-04-21 NOTE — H&P (Signed)
Joseph Chen is an 65 y.o. male.   Chief Complaint: Patient is here for colonoscopy. HPI: Asian is 70 old Caucasian male who was found to have heme-positive stool and is here for diagnostic colonoscopy. He denies melena or rectal bleeding. Bowels move regularly. He has history of gout. He had attack involving left wrist upper weeks ago and none is one in his right tenderness. He did not take Indocin. He has history of anemia and last hemoglobin was 12.3 three weeks ago. Previously Hgb was lower. MCV is elevated. Family history is negative for CRC.  Past Medical History:  Diagnosis Date  . Arthritis   . COPD with emphysema ([place])   . Diastolic dysfunction    Grade 2, LVEF 50-55% 2015  . Essential hypertension   . Gout   . History of pneumonia   . Secondary pulmonary hypertension ([facility])    PASP 78 mmHg 2015    Past Surgical History:  Procedure Laterality Date  . CIRCUMCISION    . dental extractions    . DENTAL SURGERY    . VASECTOMY      Family History  Problem Relation Age of Onset  . Rheumatic fever Father   . Aneurysm Mother   . Stroke Mother   . Asthma Brother    Social History:  reports that he quit smoking about 27 years ago. His smoking use included Cigarettes. His smokeless tobacco use includes Chew. He reports that he drinks alcohol. He reports that he does not use drugs.  Allergies: No Known Allergies  Medications Prior to Admission  Medication Sig Dispense Refill  . allopurinol (ZYLOPRIM) 300 MG tablet Take 300 mg by mouth daily.    . carvedilol (COREG) 25 MG tablet Take 25 mg by mouth 2 (two) times daily with a meal.    . colchicine 0.6 MG tablet Take 0.6 mg by mouth as needed.    . furosemide (LASIX) 40 MG tablet Take 40 mg by mouth daily.    Marland Kitchen lisinopril (PRINIVIL,ZESTRIL) 40 MG tablet Take 40 mg by mouth 2 (two) times daily.      No results found for this or any previous visit (from the past 48 hour(s)). No results found.  ROS  Blood pressure 153/82  pulse 76, temperature 99.5 F (37.5 C), temperature source Oral, resp. rate 18, height 5\' 8"  (1.727 m), weight 230 lb (104.3 kg), SpO2 97 %. Physical Exam  Constitutional: He appears well-developed and well-nourished.  HENT:  Mouth/Throat: Oropharynx is clear and moist.  Eyes: Conjunctivae are normal. No scleral icterus.  Neck: No thyromegaly present.  Cardiovascular: Normal rate, regular rhythm and normal heart sounds.   No murmur heard. Respiratory: Effort normal and breath sounds normal.  GI:  Abdomen is protuberant but soft and nontender without organomegaly or masses.  Musculoskeletal: He exhibits edema (trace edema both legs.).  Right hand is swollen. Swelling extends to forearm. Warm to touch and tender(gout attack)  Lymphadenopathy:    He has no cervical adenopathy.  Neurological: He is alert.  Skin: Skin is warm and dry.     Assessment/Plan Heme-positive stool. Mild anemia. Diagnostic colonoscopy.  Hildred Laser, MD 04/21/2016, 3:07 PM

## 2016-04-21 NOTE — Discharge Instructions (Signed)
No aspirin for 1 week. Resume usual medications and high fiber diet. Prednisone schedule as follows for gout;  30 mg today and daily for 2 more days.  20 mg daily for 3 days.  10 mg daily for 3 days.  5 mg daily for 3 days and stop. Can take Indocin or gout if needed starting on 04/22/2016. No driving for 24 hours. Physician will call with biopsy results.       Colonoscopy, Care After These instructions give you information on caring for yourself after your procedure. Your doctor may also give you more specific instructions. Call your doctor if you have any problems or questions after your procedure. HOME CARE  Do not drive for 24 hours.  Do not sign important papers or use machinery for 24 hours.  You may shower.  You may go back to your usual activities, but go slower for the first 24 hours.  Take rest breaks often during the first 24 hours.  Walk around or use warm packs on your belly (abdomen) if you have belly cramping or gas.  Drink enough fluids to keep your pee (urine) clear or pale yellow.  Resume your normal diet. Avoid heavy or fried foods.  Avoid drinking alcohol for 24 hours or as told by your doctor.  Only take medicines as told by your doctor. If a tissue sample (biopsy) was taken during the procedure:   Do not take aspirin or blood thinners for 7 days, or as told by your doctor.  Do not drink alcohol for 7 days, or as told by your doctor.  Eat soft foods for the first 24 hours. GET HELP IF: You still have a small amount of blood in your poop (stool) 2-3 days after the procedure. GET HELP RIGHT AWAY IF:  You have more than a small amount of blood in your poop.  You see clumps of tissue (blood clots) in your poop.  Your belly is puffy (swollen).  You feel sick to your stomach (nauseous) or throw up (vomit).  You have a fever.  You have belly pain that gets worse and medicine does not help. MAKE SURE YOU:  Understand these  instructions.  Will watch your condition.  Will get help right away if you are not doing well or get worse.   This information is not intended to replace advice given to you by your health care provider. Make sure you discuss any questions you have with your health care provider.   Document Released: 08/28/2010 Document Revised: 07/31/2013 Document Reviewed: 04/02/2013 Elsevier Interactive Patient Education [employer].    Diverticulosis Diverticulosis is the condition that develops when small pouches (diverticula) form in the wall of your colon. Your colon, or large intestine, is where water is absorbed and stool is formed. The pouches form when the inside layer of your colon pushes through weak spots in the outer layers of your colon. CAUSES  No one knows exactly what causes diverticulosis. RISK FACTORS  Being older than 24. Your risk for this condition increases with age. Diverticulosis is rare in people younger than 40 years. By age 46, almost everyone has it.  Eating a low-fiber diet.  Being frequently constipated.  Being overweight.  Not getting enough exercise.  Smoking.  Taking over-the-counter pain medicines, like aspirin and ibuprofen. SYMPTOMS  Most people with diverticulosis do not have symptoms. DIAGNOSIS  Because diverticulosis often has no symptoms, health care providers often discover the condition during an exam for other colon problems. In  many cases, a health care provider will diagnose diverticulosis while using a flexible scope to examine the colon (colonoscopy). TREATMENT  If you have never developed an infection related to diverticulosis, you may not need treatment. If you have had an infection before, treatment may include:  Eating more fruits, vegetables, and grains.  Taking a fiber supplement.  Taking a live bacteria supplement (probiotic).  Taking medicine to relax your colon. HOME CARE INSTRUCTIONS   Drink at least 6-8 glasses of  water each day to prevent constipation.  Try not to strain when you have a bowel movement.  Keep all follow-up appointments. If you have had an infection before:  Increase the fiber in your diet as directed by your health care provider or dietitian.  Take a dietary fiber supplement if your health care provider approves.  Only take medicines as directed by your health care provider. SEEK MEDICAL CARE IF:   You have abdominal pain.  You have bloating.  You have cramps.  You have not gone to the bathroom in 3 days. SEEK IMMEDIATE MEDICAL CARE IF:   Your pain gets worse.  Yourbloating becomes very bad.  You have a fever or chills, and your symptoms suddenly get worse.  You begin vomiting.  You have bowel movements that are bloody or black. MAKE SURE YOU:  Understand these instructions.  Will watch your condition.  Will get help right away if you are not doing well or get worse.   This information is not intended to replace advice given to you by your health care provider. Make sure you discuss any questions you have with your health care provider.   Document Released: 04/22/2004 Document Revised: 07/31/2013 Document Reviewed: 06/20/2013 Elsevier Interactive Patient Education 2016 [employer].  Hemorrhoids Hemorrhoids are swollen veins around the rectum or anus. There are two types of hemorrhoids:   Internal hemorrhoids. These occur in the veins just inside the rectum. They may poke through to the outside and become irritated and painful.  External hemorrhoids. These occur in the veins outside the anus and can be felt as a painful swelling or hard lump near the anus. CAUSES  Pregnancy.   Obesity.   Constipation or diarrhea.   Straining to have a bowel movement.   Sitting for long periods on the toilet.  Heavy lifting or other activity that caused you to strain.  Anal intercourse. SYMPTOMS   Pain.   Anal itching or irritation.   Rectal  bleeding.   Fecal leakage.   Anal swelling.   One or more lumps around the anus.  DIAGNOSIS  Your caregiver may be able to diagnose hemorrhoids by visual examination. Other examinations or tests that may be performed include:   Examination of the rectal area with a gloved hand (digital rectal exam).   Examination of anal canal using a small tube (scope).   A blood test if you have lost a significant amount of blood.  A test to look inside the colon (sigmoidoscopy or colonoscopy). TREATMENT Most hemorrhoids can be treated at home. However, if symptoms do not seem to be getting better or if you have a lot of rectal bleeding, your caregiver may perform a procedure to help make the hemorrhoids get smaller or remove them completely. Possible treatments include:   Placing a rubber band at the base of the hemorrhoid to cut off the circulation (rubber band ligation).   Injecting a chemical to shrink the hemorrhoid (sclerotherapy).   Using a tool to burn the hemorrhoid (  infrared light therapy).   Surgically removing the hemorrhoid (hemorrhoidectomy).   Stapling the hemorrhoid to block blood flow to the tissue (hemorrhoid stapling).  HOME CARE INSTRUCTIONS   Eat foods with fiber, such as whole grains, beans, nuts, fruits, and vegetables. Ask your doctor about taking products with added fiber in them (fibersupplements).  Increase fluid intake. Drink enough water and fluids to keep your urine clear or pale yellow.   Exercise regularly.   Go to the bathroom when you have the urge to have a bowel movement. Do not wait.   Avoid straining to have bowel movements.   Keep the anal area dry and clean. Use wet toilet paper or moist towelettes after a bowel movement.   Medicated creams and suppositories may be used or applied as directed.   Only take over-the-counter or prescription medicines as directed by your caregiver.   Take warm sitz baths for 15-20 minutes, 3-4  times a day to ease pain and discomfort.   Place ice packs on the hemorrhoids if they are tender and swollen. Using ice packs between sitz baths may be helpful.   Put ice in a plastic bag.   Place a towel between your skin and the bag.   Leave the ice on for 15-20 minutes, 3-4 times a day.   Do not use a donut-shaped pillow or sit on the toilet for long periods. This increases blood pooling and pain.  SEEK MEDICAL CARE IF:  You have increasing pain and swelling that is not controlled by treatment or medicine.  You have uncontrolled bleeding.  You have difficulty or you are unable to have a bowel movement.  You have pain or inflammation outside the area of the hemorrhoids. MAKE SURE YOU:  Understand these instructions.  Will watch your condition.  Will get help right away if you are not doing well or get worse.   This information is not intended to replace advice given to you by your health care provider. Make sure you discuss any questions you have with your health care provider.   Document Released: 07/23/2000 Document Revised: 07/12/2012 Document Reviewed: 05/30/2012 Elsevier Interactive Patient Education 2016 Elsevier Inc.  High-Fiber Diet Fiber, also called dietary fiber, is a type of carbohydrate found in fruits, vegetables, whole grains, and beans. A high-fiber diet can have many health benefits. Your health care provider may recommend a high-fiber diet to help:  Prevent constipation. Fiber can make your bowel movements more regular.  Lower your cholesterol.  Relieve hemorrhoids, uncomplicated diverticulosis, or irritable bowel syndrome.  Prevent overeating as part of a weight-loss plan.  Prevent heart disease, type 2 diabetes, and certain cancers. WHAT IS MY PLAN? The recommended daily intake of fiber includes:  38 grams for men under age 52.  75 grams for men over age 66.  11 grams for women under age 56.  26 grams for women over age 94. You can  get the recommended daily intake of dietary fiber by eating a variety of fruits, vegetables, grains, and beans. Your health care provider may also recommend a fiber supplement if it is not possible to get enough fiber through your diet. WHAT DO I NEED TO KNOW ABOUT A HIGH-FIBER DIET?  Fiber supplements have not been widely studied for their effectiveness, so it is better to get fiber through food sources.  Always check the fiber content on thenutrition facts label of any prepackaged food. Look for foods that contain at least 5 grams of fiber per serving.  Ask  your dietitian if you have questions about specific foods that are related to your condition, especially if those foods are not listed in the following section.  Increase your daily fiber consumption gradually. Increasing your intake of dietary fiber too quickly may cause bloating, cramping, or gas.  Drink plenty of water. Water helps you to digest fiber. WHAT FOODS CAN I EAT? Grains Whole-grain breads. Multigrain cereal. Oats and oatmeal. Brown rice. Barley. Bulgur wheat. [place]. Bran muffins. Popcorn. Rye wafer crackers. Vegetables Sweet potatoes. Spinach. Kale. Artichokes. Cabbage. Broccoli. Green peas. Carrots. Squash. Fruits Berries. Pears. Apples. Oranges. Avocados. Prunes and raisins. Dried figs. Meats and Other Protein Sources [place], kidney, pinto, and soy beans. Split peas. Lentils. Nuts and seeds. Dairy Fiber-fortified yogurt. Beverages Fiber-fortified soy milk. Fiber-fortified orange juice. Other Fiber bars. The items listed above may not be a complete list of recommended foods or beverages. Contact your dietitian for more options. WHAT FOODS ARE NOT RECOMMENDED? Grains White bread. Pasta made with refined flour. White rice. Vegetables Fried potatoes. Canned vegetables. Well-cooked vegetables.  Fruits Fruit juice. Cooked, strained fruit. Meats and Other Protein Sources Fatty cuts of meat. Fried Sales executive or fried  fish. Dairy Milk. Yogurt. Cream cheese. Sour cream. Beverages Soft drinks. Other Cakes and pastries. Butter and oils. The items listed above may not be a complete list of foods and beverages to avoid. Contact your dietitian for more information. WHAT ARE SOME TIPS FOR INCLUDING HIGH-FIBER FOODS IN MY DIET?  Eat a wide variety of high-fiber foods.  Make sure that half of all grains consumed each day are whole grains.  Replace breads and cereals made from refined flour or white flour with whole-grain breads and cereals.  Replace white rice with brown rice, bulgur wheat, or millet.  Start the day with a breakfast that is high in fiber, such as a cereal that contains at least 5 grams of fiber per serving.  Use beans in place of meat in soups, salads, or pasta.  Eat high-fiber snacks, such as berries, raw vegetables, nuts, or popcorn.   This information is not intended to replace advice given to you by your health care provider. Make sure you discuss any questions you have with your health care provider.   Document Released: 07/26/2005 Document Revised: 08/16/2014 Document Reviewed: 01/08/2014 Elsevier Interactive Patient Education [employer].

## 2016-04-21 NOTE — Op Note (Signed)
[facility] Patient Name: Joseph Chen Procedure Date: 04/21/2016 3:04 PM MRN: [id number] Date of Birth: 1951/06/06 Attending MD: Hildred Laser , MD CSN: [id number] Age: 65 Admit Type: Outpatient Procedure:                Colonoscopy Indications:              Heme positive stool Providers:                Hildred Laser, MD, Otis Peak B. Sharon Seller, RN, Sherlyn Lees, Technician Referring MD:             Ms. Lennon Alstrom. Baucom, PAC Medicines:                Meperidine 50 mg IV, Midazolam 6 mg IV Complications:            No immediate complications. Estimated Blood Loss:     Estimated blood loss: none. Procedure:                Pre-Anesthesia Assessment:                           - Prior to the procedure, a History and Physical                            was performed, and patient medications and                            allergies were reviewed. The patient's tolerance of                            previous anesthesia was also reviewed. The risks                            and benefits of the procedure and the sedation                            options and risks were discussed with the patient.                            All questions were answered, and informed consent                            was obtained. Prior Anticoagulants: The patient has                            taken no previous anticoagulant or antiplatelet                            agents. ASA Grade Assessment: III - A patient with                            severe systemic disease. After reviewing the risks  and benefits, the patient was deemed in                            satisfactory condition to undergo the procedure.                           After obtaining informed consent, the colonoscope                            was passed under direct vision. Throughout the                            procedure, the patient's blood pressure, pulse, and                             oxygen saturations were monitored continuously. The                            EC-[id number]) scope was introduced through                            the anus and advanced to the the cecum, identified                            by appendiceal orifice and ileocecal valve. The                            colonoscopy was performed without difficulty. The                            patient tolerated the procedure well. The quality                            of the bowel preparation was adequate to identify                            polyps 6 mm and larger in size. The ileocecal                            valve, appendiceal orifice, and rectum were                            photographed. Scope In: 3:19:06 PM Scope Out: 3:53:26 PM Scope Withdrawal Time: 0 hours 29 minutes 42 seconds  Total Procedure Duration: 0 hours 34 minutes 20 seconds  Findings:      A 8 mm polyp was found in the proximal ascending colon. The polyp was       semi-sessile. The polyp was removed with a hot snare. Resection and       retrieval were complete. To prevent bleeding after the polypectomy, two       hemostatic clips were successfully placed (MR conditional). There was no       bleeding at the end of the procedure.      A 10 mm polyp was found in the proximal  sigmoid colon. The polyp was       semi-pedunculated. The polyp was removed with a hot snare. Resection and       retrieval were complete. To prevent bleeding post-intervention, one       hemostatic clip was successfully placed (MR conditional). There was no       bleeding at the end of the procedure.      A few medium-mouthed diverticula were found in the proximal sigmoid       colon, mid sigmoid colon and distal sigmoid colon.      External hemorrhoids were found during retroflexion. The hemorrhoids       were small. Impression:               - One 8 mm polyp in the proximal ascending colon,                            removed with a hot snare.  Resected and retrieved.                            Clips (MR conditional) were placed.                           - One 10 mm polyp in the proximal sigmoid colon,                            removed with a hot snare. Resected and retrieved.                            Clip (MR conditional) was placed.                           - Diverticulosis in the proximal sigmoid colon, in                            the mid sigmoid colon and in the distal sigmoid                            colon.                           - External hemorrhoids.                           comment: clips used to minimize risk of post                            polypectomy bleed since patient may have to go on                            NSAID for acute gout. Moderate Sedation:      Moderate (conscious) sedation was administered by the endoscopy nurse       and supervised by the endoscopist. The following parameters were       monitored: oxygen saturation, heart rate, blood pressure, CO2       capnography and response to care. Total physician intraservice time was  40 minutes. Recommendation:           - Patient has a contact number available for                            emergencies. The signs and symptoms of potential                            delayed complications were discussed with the                            patient. Return to normal activities tomorrow.                            Written discharge instructions were provided to the                            patient.                           - High fiber diet today.                           - Continue present medications.                           - Await pathology results.                           - Will check H&H and s B12 level today.                           - Will start him on prednisone for acute gout.                           - He can resume indomethacin after 24 hours.                           - Repeat colonoscopy in 5 months for  surveillance. Procedure Code(s):        --- Professional ---                           [phone], Colonoscopy, flexible; with removal of                            tumor(s), polyp(s), or other lesion(s) by snare                            technique                           99152, Moderate sedation services provided by the                            same physician or other qualified health care  professional performing the diagnostic or                            therapeutic service that the sedation supports,                            requiring the presence of an independent trained                            observer to assist in the monitoring of the                            patient's level of consciousness and physiological                            status; initial 15 minutes of intraservice time,                            patient age 48 years or older                           [phone], Moderate sedation services; each additional                            15 minutes intraservice time                           [phone], Moderate sedation services; each additional                            15 minutes intraservice time Diagnosis Code(s):        --- Professional ---                           D12.2, Benign neoplasm of ascending colon                           D12.5, Benign neoplasm of sigmoid colon                           K64.4, Residual hemorrhoidal skin tags                           R19.5, Other fecal abnormalities                           K57.30, Diverticulosis of large intestine without                            perforation or abscess without bleeding CPT copyright 2016 American Medical Association. All rights reserved. The codes documented in this report are preliminary and upon coder review may  be revised to meet current compliance requirements. Hildred Laser, MD Hildred Laser, MD 04/21/2016 4:05:24 PM This report has been signed electronically. Number  of Addenda: 0

## 2016-04-27 ENCOUNTER — Encounter (HOSPITAL_COMMUNITY): Payer: Self-pay | Admitting: Internal Medicine

## 2016-06-21 ENCOUNTER — Emergency Department (HOSPITAL_COMMUNITY): Payer: Medicare HMO

## 2016-06-21 ENCOUNTER — Emergency Department (HOSPITAL_COMMUNITY)
Admission: EM | Admit: 2016-06-21 | Discharge: 2016-06-21 | Disposition: A | Discharge status: Home / Self Care | Payer: Medicare HMO | Attending: Emergency Medicine | Admitting: Emergency Medicine

## 2016-06-21 ENCOUNTER — Encounter (HOSPITAL_COMMUNITY): Payer: Self-pay | Admitting: Emergency Medicine

## 2016-06-21 DIAGNOSIS — Y929 Unspecified place or not applicable: Secondary | ICD-10-CM | POA: Insufficient documentation

## 2016-06-21 DIAGNOSIS — J449 Chronic obstructive pulmonary disease, unspecified: Secondary | ICD-10-CM | POA: Diagnosis not present

## 2016-06-21 DIAGNOSIS — S29019A Strain of muscle and tendon of unspecified wall of thorax, initial encounter: Secondary | ICD-10-CM | POA: Insufficient documentation

## 2016-06-21 DIAGNOSIS — W010XXA Fall on same level from slipping, tripping and stumbling without subsequent striking against object, initial encounter: Secondary | ICD-10-CM | POA: Insufficient documentation

## 2016-06-21 DIAGNOSIS — Y999 Unspecified external cause status: Secondary | ICD-10-CM | POA: Insufficient documentation

## 2016-06-21 DIAGNOSIS — I509 Heart failure, unspecified: Secondary | ICD-10-CM | POA: Diagnosis not present

## 2016-06-21 DIAGNOSIS — Z79899 Other long term (current) drug therapy: Secondary | ICD-10-CM | POA: Diagnosis not present

## 2016-06-21 DIAGNOSIS — I110 Hypertensive heart disease with heart failure: Secondary | ICD-10-CM | POA: Diagnosis not present

## 2016-06-21 DIAGNOSIS — F17220 Nicotine dependence, chewing tobacco, uncomplicated: Secondary | ICD-10-CM | POA: Diagnosis not present

## 2016-06-21 DIAGNOSIS — S299XXA Unspecified injury of thorax, initial encounter: Secondary | ICD-10-CM | POA: Diagnosis present

## 2016-06-21 DIAGNOSIS — Y939 Activity, unspecified: Secondary | ICD-10-CM | POA: Diagnosis not present

## 2016-06-21 DIAGNOSIS — W19XXXA Unspecified fall, initial encounter: Secondary | ICD-10-CM

## 2016-06-21 MED ORDER — DOCUSATE SODIUM 100 MG PO CAPS: 100.0000 mg | ORAL_CAPSULE | Freq: Two times a day (BID) | ORAL | 0 refills | Status: DC

## 2016-06-21 MED ORDER — TRAMADOL HCL 50 MG PO TABS
50.0000 mg | ORAL_TABLET | Freq: Once | ORAL | Status: AC
  Administered 2016-06-21: 50 mg via ORAL
  Filled 2016-06-21: qty 1

## 2016-06-21 MED ORDER — HYDROCODONE-ACETAMINOPHEN 5-325 MG PO TABS: 2.0000 | ORAL_TABLET | ORAL | 0 refills | Status: DC | PRN

## 2016-06-21 NOTE — ED Triage Notes (Signed)
Pt reports slipping on wet ground and falling. Pt c/o pain in his upper back. Ambulatory to Triage.

## 2016-06-21 NOTE — Discharge Instructions (Signed)
You have been seen in the Emergency Department (ED)  today for back pain.  Your workup and exam have not shown any acute abnormalities and you are likely suffering from muscle strain or possible problems with your discs, but there is no treatment that will fix your symptoms at this time.  Please take Motrin (ibuprofen) as needed for your pain according to the instructions written on the box.  Alternatively, for the next five days you can take 600mg  three times daily with meals (it may upset your stomach).  Take Vicodin as prescribed for severe pain. Do not drink alcohol, drive or participate in any other potentially dangerous activities while taking this medication as it may make you sleepy. Do not take this medication with any other sedating medications, either prescription or over-the-counter. If you were prescribed Percocet or Vicodin, do not take these with acetaminophen (Tylenol) as it is already contained within these medications.   This medication is an opiate (or narcotic) pain medication and can be habit forming.  Use it as little as possible to achieve adequate pain control.  Do not use or use it with extreme caution if you have a history of opiate abuse or dependence.  If you are on a pain contract with your primary care doctor or a pain specialist, be sure to let them know you were prescribed this medication today from the [facility] Emergency Department.  This medication is intended for your use only - do not give any to anyone else and keep it in a secure place where nobody else, especially children, have access to it.  It will also cause or worsen constipation, so you may want to consider taking an over-the-counter stool softener while you are taking this medication.  Please follow up with your doctor as soon as possible regarding today's ED visit and your back pain.  Return to the ED for worsening back pain, fever, weakness or numbness of either leg, or if you develop either (1) an  inability to urinate or have bowel movements, or (2) loss of your ability to control your bathroom functions (if you start having "accidents"), or if you develop other new symptoms that concern you.

## 2016-06-21 NOTE — ED Provider Notes (Signed)
Emergency Department Provider Note  By signing my name below, I, Ephriam Jenkins, attest that this documentation has been prepared under the direction and in the presence of No att. providers found. Electronically signed, Ephriam Jenkins, ED Scribe. 06/21/16. 10:23 PM.  I have reviewed the triage vital signs and the nursing notes.  HISTORY  Chief Complaint Fall  HPI HPI Comments: Joseph Chen is a 65 y.o. male who presents to the Emergency Department complaining of sudden onset mid-upper back pain s/p an accidental fall that occurred just prior to arrival. Pt slipped on wet ground and fell forward. His back pain is exacerbated when bending forward and twisting. Pt is able to ambulate but with increased difficulty due to pain. He denies any LOC. He denies any acute numbness or tingling. No bowel or bladder incontinence.   Past Medical History:  Diagnosis Date  . Arthritis   . COPD with emphysema ([place])   . Diastolic dysfunction    Grade 2, LVEF 50-55% 2015  . Essential hypertension   . Gout   . History of pneumonia   . Secondary pulmonary hypertension    PASP 78 mmHg 2015    Patient Active Problem List   Diagnosis Date Noted  . Guaiac positive stools 03/01/2016  . Pain in joint, shoulder region 06/19/2014  . ETOH abuse 06/19/2014  . Thrombocytopenia ([place]) 06/19/2014  . Morbid obesity ([place]) 06/19/2014  . Gout   . SOB (shortness of breath) 06/18/2014  . CHF with unknown LVEF ([place]) 06/18/2014  . Fever 06/18/2014  . CAP (community acquired pneumonia) 06/18/2014  . Pleural effusion, bilateral 06/18/2014  . Hypokalemia 06/18/2014  . Hypoxia 06/18/2014  . Hypertension   . Essential hypertension     Past Surgical History:  Procedure Laterality Date  . CIRCUMCISION    . COLONOSCOPY N/A 04/21/2016   Procedure: COLONOSCOPY;  Surgeon: Rogene Houston, MD;  Location: [facility];  Service: Endoscopy;  Laterality: N/A;  2:00  . dental extractions    . DENTAL SURGERY    .  VASECTOMY      Current Outpatient Rx  . Order #: [id number] Class: Historical Med  . Order #: [id number] Class: Historical Med  . Order #: [id number] Class: Historical Med  . Order #: [id number] Class: Print  . Order #: [id number] Class: Historical Med  . Order #: [id number] Class: Print  . Order #: [id number] Class: Historical Med  . Order #: [id number] Class: Normal    Allergies Patient has no known allergies.  Family History  Problem Relation Age of Onset  . Rheumatic fever Father   . Aneurysm Mother   . Stroke Mother   . Asthma Brother     Social History Social History  Substance Use Topics  . Smoking status: Former Smoker    Types: Cigarettes    Quit date: 09/17/1988  . Smokeless tobacco: Current User    Types: Chew  . Alcohol use 0.0 oz/week     Comment: 4 or more drinks a week    Review of Systems  Constitutional: No fever/chills Eyes: No visual changes. ENT: No sore throat. Cardiovascular: Denies chest pain. Respiratory: Denies shortness of breath. Gastrointestinal: No abdominal pain.  No nausea, no vomiting.  No diarrhea.  No constipation. Genitourinary: Negative for dysuria. Musculoskeletal: + mid/upper back pain Skin: Negative for rash. Neurological: Negative for headaches, focal weakness or numbness.  10-point ROS otherwise negative.  ____________________________________________   PHYSICAL EXAM:  VITAL SIGNS: ED Triage Vitals  Enc Vitals Group     BP 06/21/16 2119 155/84  Pulse Rate 06/21/16 2119 79     Resp 06/21/16 2119 19     Temp 06/21/16 2119 98 F (36.7 C)     Temp Source 06/21/16 2119 Oral     SpO2 06/21/16 2119 93 %     Weight 06/21/16 2120 240 lb (108.9 kg)     Height 06/21/16 2120 5\' 8"  (1.727 m)     Pain Score 06/21/16 2118 10   Constitutional: Alert and oriented. Well appearing and in no acute distress. Eyes: Conjunctivae are normal.  Head: Atraumatic. Nose: No congestion/rhinnorhea. Mouth/Throat: Mucous membranes are moist.   Oropharynx non-erythematous. Neck: No stridor. No cervical spine tenderness to palpation. Cardiovascular: Normal rate, regular rhythm. Good peripheral circulation. Grossly normal heart sounds.   Respiratory: Normal respiratory effort.  No retractions. Lungs CTAB. Gastrointestinal: Soft and nontender. No distention.  Musculoskeletal: No lower extremity tenderness nor edema. No gross deformities of extremities. Tenderness to palpation along the upper to mid thoracic spine.  Neurologic:  Normal speech and language. No gross focal neurologic deficits are appreciated.  Skin:  Skin is warm, dry and intact. No rash noted.  ____________________________________________  RADIOLOGY  Dg Thoracic Spine 2 View  Result Date: 06/21/2016 CLINICAL DATA:  Status post fall.  Upper back pain. EXAM: THORACIC SPINE 2 VIEWS COMPARISON:  Chest radiograph 07/02/2014 FINDINGS: There is flowing osteophytosis along most of the thoracic spine. Alignment of the thoracic spine is normal. There is focal kyphosis of the lower thoracic spine, near the thoracolumbar junction. Atherosclerotic calcification is seen within the aorta. IMPRESSION: 1. Flowing anterior and lateral osteophytes of the thoracic spine, consistent with diffuse idiopathic skeletal hyperostosis (DISH). 2. Focal kyphosis of the lower thoracic spine. If there is concern for acute fracture, CT is recommended. Electronically Signed   By: Ulyses Jarred M.D.   On: 06/21/2016 21:57   Ct Thoracic Spine Wo Contrast  Result Date: 06/21/2016 CLINICAL DATA:  Acute upper back pain after fall immediately prior to arrival, slipped on ground. Follow-up abnormal radiographs. EXAM: CT THORACIC SPINE WITHOUT CONTRAST TECHNIQUE: Multidetector CT imaging of the thoracic spine was performed without intravenous contrast administration. Multiplanar CT image reconstructions were also generated. COMPARISON:  Thoracic spine radiograph June 21, 2016 FINDINGS: Noisy image quality.  ALIGNMENT: Thoracic vertebral bodies are in alignment. Maintained thoracic lordosis. OSSEOUS STRUCTURES: Thoracic vertebral bodies and posterior elements are intact. Bridging ventral osteophytes with T4-5 through T12-L1 auto interbody arthrodesis. No destructive bony lesions. SOFT TISSUES: Included prevertebral and paraspinal soft tissues are nonacute. Severe coronary artery calcifications, cardiomegaly. IMPRESSION: No acute fracture or malalignment. DISH, and ankylosis. Cardiomegaly and severe coronary artery calcifications. Electronically Signed   By: Elon Alas M.D.   On: 06/21/2016 22:53    ____________________________________________   PROCEDURES  Procedure(s) performed:   Procedures  None ____________________________________________   INITIAL IMPRESSION / ASSESSMENT AND PLAN / ED COURSE  Pertinent labs & imaging results that were available during my care of the patient were reviewed by me and considered in my medical decision making (see chart for details).  DIAGNOSTIC STUDIES: Oxygen Saturation is 93% on RA, adequate by my interpretation.  COORDINATION OF CARE: 10:21 PM-Will order CT scan of t-spine. Discussed treatment plan with pt at bedside and pt agreed to plan.   Patient presents to the ED for evaluation of thoracic spine pain after mechanical fall. Tenderness to palpation along the thoracic spine. Unclear is this correlates with the abnormality seen on plain film. No neurological deficit appreciated. Plan for CT scan of the thoracic  spine and pain control in the ED.   10:55 PM CT scan with no acute fracture. Plan for discharge home with pain control medication and PCP follow up. Encouraged early movement and PCP follow up.   At this time, I do not feel there is any life-threatening condition present. I have reviewed and discussed all results (EKG, imaging, lab, urine as appropriate), exam findings with patient. I have reviewed nursing notes and appropriate previous  records.  I feel the patient is safe to be discharged home without further emergent workup. Discussed usual and customary return precautions. Patient and family (if present) verbalize understanding and are comfortable with this plan.  Patient will follow-up with their primary care provider. If they do not have a primary care provider, information for follow-up has been provided to them. All questions have been answered.  ____________________________________________  FINAL CLINICAL IMPRESSION(S) / ED DIAGNOSES  Final diagnoses:  Fall, initial encounter  Strain of thoracic region, initial encounter     MEDICATIONS GIVEN DURING THIS VISIT:  Medications  traMADol (ULTRAM) tablet 50 mg (50 mg Oral Given 06/21/16 2243)     NEW OUTPATIENT MEDICATIONS STARTED DURING THIS VISIT:  Discharge Medication List as of 06/21/2016 11:09 PM    START taking these medications   Details  docusate sodium (COLACE) 100 MG capsule Take 1 capsule (100 mg total) by mouth every 12 (twelve) hours., Starting Mon 06/21/2016, Print    HYDROcodone-acetaminophen (NORCO/VICODIN) 5-325 MG tablet Take 2 tablets by mouth every 4 (four) hours as needed., Starting Mon 06/21/2016, Print       I personally performed the services described in this documentation, which was scribed in my presence. The recorded information has been reviewed and is accurate.    Note:  This document was prepared using Dragon voice recognition software and may include unintentional dictation errors.  Nanda Quinton, MD Emergency Medicine    Margette Fast, MD 06/22/16 [phone]

## 2016-08-04 ENCOUNTER — Other Ambulatory Visit (HOSPITAL_COMMUNITY)
Admission: RE | Admit: 2016-08-04 | Discharge: 2016-08-04 | Disposition: A | Discharge status: Home / Self Care | Payer: Medicare HMO | Source: Ambulatory Visit | Attending: Urology | Admitting: Urology

## 2016-08-04 ENCOUNTER — Ambulatory Visit (INDEPENDENT_AMBULATORY_CARE_PROVIDER_SITE_OTHER): Payer: Medicare HMO | Admitting: Urology

## 2016-08-04 DIAGNOSIS — R310 Gross hematuria: Secondary | ICD-10-CM | POA: Diagnosis not present

## 2016-08-11 ENCOUNTER — Other Ambulatory Visit: Payer: Self-pay | Admitting: Urology

## 2016-08-11 DIAGNOSIS — R310 Gross hematuria: Secondary | ICD-10-CM

## 2016-08-13 ENCOUNTER — Encounter (HOSPITAL_COMMUNITY): Payer: Self-pay | Admitting: Emergency Medicine

## 2016-08-13 ENCOUNTER — Observation Stay (HOSPITAL_COMMUNITY)
Admission: EM | Admit: 2016-08-13 | Discharge: 2016-08-14 | Disposition: A | Discharge status: Home / Self Care | Payer: Medicare HMO | Attending: Family Medicine | Admitting: Family Medicine

## 2016-08-13 ENCOUNTER — Emergency Department (HOSPITAL_COMMUNITY): Payer: Medicare HMO

## 2016-08-13 DIAGNOSIS — I10 Essential (primary) hypertension: Secondary | ICD-10-CM | POA: Diagnosis present

## 2016-08-13 DIAGNOSIS — Z79899 Other long term (current) drug therapy: Secondary | ICD-10-CM | POA: Insufficient documentation

## 2016-08-13 DIAGNOSIS — R778 Other specified abnormalities of plasma proteins: Secondary | ICD-10-CM | POA: Diagnosis present

## 2016-08-13 DIAGNOSIS — I5033 Acute on chronic diastolic (congestive) heart failure: Secondary | ICD-10-CM | POA: Diagnosis not present

## 2016-08-13 DIAGNOSIS — F17220 Nicotine dependence, chewing tobacco, uncomplicated: Secondary | ICD-10-CM | POA: Insufficient documentation

## 2016-08-13 DIAGNOSIS — N289 Disorder of kidney and ureter, unspecified: Secondary | ICD-10-CM | POA: Diagnosis not present

## 2016-08-13 DIAGNOSIS — R079 Chest pain, unspecified: Secondary | ICD-10-CM | POA: Diagnosis present

## 2016-08-13 DIAGNOSIS — R748 Abnormal levels of other serum enzymes: Secondary | ICD-10-CM | POA: Diagnosis not present

## 2016-08-13 DIAGNOSIS — J449 Chronic obstructive pulmonary disease, unspecified: Secondary | ICD-10-CM | POA: Diagnosis not present

## 2016-08-13 DIAGNOSIS — I110 Hypertensive heart disease with heart failure: Secondary | ICD-10-CM | POA: Diagnosis not present

## 2016-08-13 DIAGNOSIS — J9601 Acute respiratory failure with hypoxia: Secondary | ICD-10-CM | POA: Diagnosis present

## 2016-08-13 DIAGNOSIS — I509 Heart failure, unspecified: Secondary | ICD-10-CM

## 2016-08-13 DIAGNOSIS — R7989 Other specified abnormal findings of blood chemistry: Secondary | ICD-10-CM | POA: Diagnosis present

## 2016-08-13 DIAGNOSIS — F1010 Alcohol abuse, uncomplicated: Secondary | ICD-10-CM | POA: Diagnosis present

## 2016-08-13 LAB — COMPREHENSIVE METABOLIC PANEL
ALK PHOS: 58 U/L (ref 38–126)
ALT: 10 U/L — ABNORMAL LOW (ref 17–63)
ANION GAP: 10 (ref 5–15)
AST: 18 U/L (ref 15–41)
Albumin: 4 g/dL (ref 3.5–5.0)
BILIRUBIN TOTAL: 1.3 mg/dL — AB (ref 0.3–1.2)
BUN: 31 mg/dL — ABNORMAL HIGH (ref 6–20)
CALCIUM: 9.3 mg/dL (ref 8.9–10.3)
CO2: 34 mmol/L — ABNORMAL HIGH (ref 22–32)
Chloride: 95 mmol/L — ABNORMAL LOW (ref 101–111)
Creatinine, Ser: 1.51 mg/dL — ABNORMAL HIGH (ref 0.61–1.24)
GFR, EST AFRICAN AMERICAN: 54 mL/min — AB (ref >60)
GFR, EST NON AFRICAN AMERICAN: 47 mL/min — AB (ref >60)
GLUCOSE: 103 mg/dL — AB (ref 65–99)
POTASSIUM: 3.6 mmol/L (ref 3.5–5.1)
Sodium: 139 mmol/L (ref 135–145)
TOTAL PROTEIN: 7.1 g/dL (ref 6.5–8.1)

## 2016-08-13 LAB — CBC WITH DIFFERENTIAL/PLATELET
Basophils Absolute: 0 10*3/uL (ref 0.0–0.1)
Basophils Relative: 0 %
Eosinophils Absolute: 0.1 10*3/uL (ref 0.0–0.7)
Eosinophils Relative: 1 %
HEMATOCRIT: 52.5 % — AB (ref 39.0–52.0)
HEMOGLOBIN: 16.8 g/dL (ref 13.0–17.0)
LYMPHS ABS: 1.6 10*3/uL (ref 0.7–4.0)
Lymphocytes Relative: 18 %
MCH: 33.7 pg (ref 26.0–34.0)
MCHC: 32 g/dL (ref 30.0–36.0)
MCV: 105.4 fL — AB (ref 78.0–100.0)
MONO ABS: 0.7 10*3/uL (ref 0.1–1.0)
MONOS PCT: 8 %
NEUTROS ABS: 6.5 10*3/uL (ref 1.7–7.7)
NEUTROS PCT: 73 %
Platelets: 127 10*3/uL — ABNORMAL LOW (ref 150–400)
RBC: 4.98 MIL/uL (ref 4.22–5.81)
RDW: 14.8 % (ref 11.5–15.5)
WBC: 8.9 10*3/uL (ref 4.0–10.5)

## 2016-08-13 LAB — BRAIN NATRIURETIC PEPTIDE: B NATRIURETIC PEPTIDE 5: 1140 pg/mL — AB (ref 0.0–100.0)

## 2016-08-13 LAB — TROPONIN I
Troponin I: 0.03 ng/mL (ref <0.03)
Troponin I: 0.03 ng/mL (ref <0.03)

## 2016-08-13 MED ORDER — ENOXAPARIN SODIUM 60 MG/0.6ML ~~LOC~~ SOLN
55.0000 mg | SUBCUTANEOUS | Status: DC
  Administered 2016-08-13: 18:00:00 55 mg via SUBCUTANEOUS
  Filled 2016-08-13: qty 0.6

## 2016-08-13 MED ORDER — LORAZEPAM 2 MG/ML IJ SOLN: 0.0000 mg | Freq: Four times a day (QID) | INTRAMUSCULAR | Status: DC

## 2016-08-13 MED ORDER — ADULT MULTIVITAMIN W/MINERALS CH
1.0000 | ORAL_TABLET | Freq: Every day | ORAL | Status: DC
  Administered 2016-08-13 – 2016-08-14 (×2): 1 via ORAL
  Filled 2016-08-13 (×2): qty 1

## 2016-08-13 MED ORDER — TAMSULOSIN HCL 0.4 MG PO CAPS
0.4000 mg | ORAL_CAPSULE | Freq: Every day | ORAL | Status: DC
  Administered 2016-08-14: 0.4 mg via ORAL
  Filled 2016-08-13: qty 1

## 2016-08-13 MED ORDER — FUROSEMIDE 10 MG/ML IJ SOLN
60.0000 mg | Freq: Once | INTRAMUSCULAR | Status: AC
  Administered 2016-08-13: 60 mg via INTRAVENOUS
  Filled 2016-08-13: qty 6

## 2016-08-13 MED ORDER — FUROSEMIDE 10 MG/ML IJ SOLN: 40.0000 mg | Freq: Once | INTRAMUSCULAR | Status: DC

## 2016-08-13 MED ORDER — FOLIC ACID 1 MG PO TABS
1.0000 mg | ORAL_TABLET | Freq: Every day | ORAL | Status: DC
  Administered 2016-08-13 – 2016-08-14 (×2): 1 mg via ORAL
  Filled 2016-08-13 (×2): qty 1

## 2016-08-13 MED ORDER — HYDRALAZINE HCL 25 MG PO TABS
25.0000 mg | ORAL_TABLET | Freq: Three times a day (TID) | ORAL | Status: DC
Start: 2016-08-13 — End: 2016-08-14
  Administered 2016-08-13 – 2016-08-14 (×3): 25 mg via ORAL
  Filled 2016-08-13 (×3): qty 1

## 2016-08-13 MED ORDER — FUROSEMIDE 10 MG/ML IJ SOLN
60.0000 mg | Freq: Two times a day (BID) | INTRAMUSCULAR | Status: DC
  Administered 2016-08-13 – 2016-08-14 (×2): 60 mg via INTRAVENOUS
  Filled 2016-08-13 (×2): qty 6

## 2016-08-13 MED ORDER — POTASSIUM CHLORIDE CRYS ER 20 MEQ PO TBCR
40.0000 meq | EXTENDED_RELEASE_TABLET | Freq: Two times a day (BID) | ORAL | Status: DC
  Administered 2016-08-13 – 2016-08-14 (×2): 40 meq via ORAL
  Filled 2016-08-13 (×2): qty 2

## 2016-08-13 MED ORDER — THIAMINE HCL 100 MG/ML IJ SOLN: 100.0000 mg | Freq: Every day | INTRAMUSCULAR | Status: DC

## 2016-08-13 MED ORDER — TIOTROPIUM BROMIDE MONOHYDRATE 18 MCG IN CAPS
1.0000 | ORAL_CAPSULE | Freq: Every day | RESPIRATORY_TRACT | Status: DC
  Administered 2016-08-14: 18 ug via RESPIRATORY_TRACT
  Filled 2016-08-13: qty 5

## 2016-08-13 MED ORDER — LORAZEPAM 1 MG PO TABS: 1.0000 mg | ORAL_TABLET | Freq: Four times a day (QID) | ORAL | Status: DC | PRN

## 2016-08-13 MED ORDER — LORAZEPAM 2 MG/ML IJ SOLN: 0.0000 mg | Freq: Two times a day (BID) | INTRAMUSCULAR | Status: DC

## 2016-08-13 MED ORDER — ALBUTEROL SULFATE (2.5 MG/3ML) 0.083% IN NEBU: 2.5000 mg | INHALATION_SOLUTION | Freq: Every day | RESPIRATORY_TRACT | Status: DC | PRN

## 2016-08-13 MED ORDER — ONDANSETRON HCL 4 MG/2ML IJ SOLN: 4.0000 mg | Freq: Four times a day (QID) | INTRAMUSCULAR | Status: DC | PRN

## 2016-08-13 MED ORDER — ACETAMINOPHEN 325 MG PO TABS: 650.0000 mg | ORAL_TABLET | ORAL | Status: DC | PRN

## 2016-08-13 MED ORDER — ALLOPURINOL 300 MG PO TABS
300.0000 mg | ORAL_TABLET | Freq: Every day | ORAL | Status: DC
  Administered 2016-08-14: 300 mg via ORAL
  Filled 2016-08-13: qty 1

## 2016-08-13 MED ORDER — VITAMIN B-1 100 MG PO TABS
100.0000 mg | ORAL_TABLET | Freq: Every day | ORAL | Status: DC
  Administered 2016-08-13 – 2016-08-14 (×2): 100 mg via ORAL
  Filled 2016-08-13 (×2): qty 1

## 2016-08-13 MED ORDER — CARVEDILOL 12.5 MG PO TABS
25.0000 mg | ORAL_TABLET | Freq: Two times a day (BID) | ORAL | Status: DC
  Administered 2016-08-14: 25 mg via ORAL
  Filled 2016-08-13: qty 2

## 2016-08-13 MED ORDER — ALBUTEROL SULFATE HFA 108 (90 BASE) MCG/ACT IN AERS: 2.0000 | INHALATION_SPRAY | Freq: Every day | RESPIRATORY_TRACT | Status: DC | PRN

## 2016-08-13 MED ORDER — SODIUM CHLORIDE 0.9% FLUSH: 3.0000 mL | INTRAVENOUS | Status: DC | PRN

## 2016-08-13 MED ORDER — SODIUM CHLORIDE 0.9% FLUSH
3.0000 mL | Freq: Two times a day (BID) | INTRAVENOUS | Status: DC
  Administered 2016-08-13 – 2016-08-14 (×2): 3 mL via INTRAVENOUS

## 2016-08-13 MED ORDER — IPRATROPIUM-ALBUTEROL 0.5-2.5 (3) MG/3ML IN SOLN
3.0000 mL | Freq: Once | RESPIRATORY_TRACT | Status: AC
  Administered 2016-08-13: 3 mL via RESPIRATORY_TRACT
  Filled 2016-08-13: qty 3

## 2016-08-13 MED ORDER — LORAZEPAM 2 MG/ML IJ SOLN
1.0000 mg | Freq: Four times a day (QID) | INTRAMUSCULAR | Status: DC | PRN
  Administered 2016-08-13: 1 mg via INTRAVENOUS
  Filled 2016-08-13: qty 1

## 2016-08-13 MED ORDER — SODIUM CHLORIDE 0.9 % IV SOLN: 250.0000 mL | INTRAVENOUS | Status: DC | PRN

## 2016-08-13 NOTE — ED Triage Notes (Signed)
Pt reports shortness of breath for last several weeks pt sent here from [facility]. Pt reports right sided chest pain, dyspnea with exertion, BLE swelling.

## 2016-08-13 NOTE — H&P (Signed)
History and Physical  Joseph Chen [id number] DOB: 12-08-1950 DOA: 08/13/2016  Referring physician: Dr Dayna Barker, ED physician PCP: Antionette Fairy, PA-C  Outpatient Specialists: none   Chief Complaint: Shortness of breath  HPI: Joseph Chen is a 66 y.o. male with a history of chronic diastolic heart failure grade 1 with normal EF on echocardiogram December 2016, COPD with emphysema, gout, hypertension, secondary pulmonary hypertension. Patient presents with worsening shortness of breath. The patient states that he has been increasingly short of breath over the past few months, but worse over the past 2-3 weeks. He has been working with his primary care physician to improve his breathing, however he has been unsuccessful. His PCP increased his Lasix from 40mg  once a day to 40 mg twice daily, which occurred a couple weeks ago, however the patient has been forgetting to take his afternoon dose. He notes some swelling in his legs bilaterally and notes that his breathing is worse when he is walking around and improved at rest. Denies increased salt intake, orthopnea, cough, wheezing, fevers.  Emergency Department Course: Elevated proBNP and troponin 0.03 with chest x-ray suggestive of failure on my read. Patient received a dose of IV Lasix in the emergency department with good diuresis, however patient still hypoxic when ambulating to the mid 80s.  Review of Systems:   Pt denies any fevers, chills, nausea, vomiting, diarrhea, constipation, abdominal pain, orthopnea, cough, wheezing, palpitations, headache, vision changes, lightheadedness, dizziness, melena, rectal bleeding.  Review of systems are otherwise negative  Past Medical History:  Diagnosis Date  . Arthritis   . COPD with emphysema ([place])   . Diastolic dysfunction    Grade 2, LVEF 50-55% 2015  . Essential hypertension   . Gout   . History of pneumonia   . Secondary pulmonary hypertension    PASP 78 mmHg 2015   Past Surgical  History:  Procedure Laterality Date  . CIRCUMCISION    . COLONOSCOPY N/A 04/21/2016   Procedure: COLONOSCOPY;  Surgeon: Rogene Houston, MD;  Location: [facility];  Service: Endoscopy;  Laterality: N/A;  2:00  . dental extractions    . DENTAL SURGERY    . VASECTOMY     Social History:  reports that he quit smoking about 27 years ago. His smoking use included Cigarettes. His smokeless tobacco use includes Chew. He reports that he drinks alcohol. He reports that he does not use drugs. Patient lives at Home  No Known Allergies  Family History  Problem Relation Age of Onset  . Rheumatic fever Father   . Aneurysm Mother   . Stroke Mother   . Asthma Brother       Prior to Admission medications   Medication Sig Start Date End Date Taking? Authorizing Provider  allopurinol (ZYLOPRIM) 300 MG tablet Take 300 mg by mouth daily.   Yes Historical Provider, MD  azithromycin (ZITHROMAX) 250 MG tablet Take 1 tablet by mouth daily. 08/11/16  Yes Historical Provider, MD  carvedilol (COREG) 25 MG tablet Take 25 mg by mouth 2 (two) times daily with a meal.   Yes Historical Provider, MD  furosemide (LASIX) 40 MG tablet Take 40 mg by mouth 2 (two) times daily.    Yes Historical Provider, MD  lisinopril (PRINIVIL,ZESTRIL) 40 MG tablet Take 40 mg by mouth 2 (two) times daily.   Yes Historical Provider, MD  potassium chloride (K-DUR) 10 MEQ tablet Take 1 tablet by mouth 2 (two) times daily.  07/14/16  Yes Historical Provider, MD  [facility]  HANDIHALER 18 MCG inhalation capsule Place 1 capsule into inhaler and inhale daily. 06/25/16  Yes Historical Provider, MD  tamsulosin (FLOMAX) 0.4 MG CAPS capsule Take 1 capsule by mouth daily. 06/11/16  Yes Historical Provider, MD  VENTOLIN HFA 108 (90 Base) MCG/ACT inhaler Inhale 2 puffs into the lungs daily as needed for shortness of breath. 05/21/16  Yes Historical Provider, MD    Physical Exam: BP 157/76   Pulse 63   Temp 98.3 F (36.8 C) (Oral)   Resp 24   Ht 5'  8" (1.727 m)   Wt 113.9 kg (251 lb)   SpO2 93%   BMI 38.16 kg/m   General: Older Caucasian male. Awake and alert and oriented x3. No acute cardiopulmonary distress.  HEENT: Normocephalic atraumatic.  Right and left ears normal in appearance.  Pupils equal, round, reactive to light. Extraocular muscles are intact. Sclerae anicteric and noninjected.  Moist mucosal membranes. No mucosal lesions.  Neck: Neck supple without lymphadenopathy. No carotid bruits. No masses palpated.  Cardiovascular: Regular rate with normal S1-S2 sounds. No murmurs, rubs, gallops auscultated. No JVD appreciated due to body habitus. Trace pedal edema  Respiratory: Diminished breath sounds throughout. Prolonged exhalation phase. Rales in bases bilaterally. No wheezes or rhonchi. No accessory muscle use. Abdomen: Obese. Soft, nontender, nondistended. Active bowel sounds. No masses or hepatosplenomegaly  Skin: No rashes, lesions, or ulcerations.  Dry, warm to touch. 2+ dorsalis pedis and radial pulses. Musculoskeletal: No calf or leg pain. All major joints not erythematous nontender.  No upper or lower joint deformation.  Good ROM.  No contractures  Psychiatric: Intact judgment and insight. Pleasant and cooperative. Neurologic: No focal neurological deficits. Strength is 5/5 and symmetric in upper and lower extremities.  Cranial nerves II through XII are grossly intact.           Labs on Admission: I have personally reviewed following labs and imaging studies  CBC:  Recent Labs Lab 08/13/16 1221  WBC 8.9  NEUTROABS 6.5  HGB 16.8  HCT 52.5*  MCV 105.4*  PLT [id number]*   Basic Metabolic Panel:  Recent Labs Lab 08/13/16 1221  NA 139  K 3.6  CL 95*  CO2 34*  GLUCOSE 103*  BUN 31*  CREATININE 1.51*  CALCIUM 9.3   GFR: Estimated Creatinine Clearance: 59.7 mL/min (by C-G formula based on SCr of 1.51 mg/dL (H)). Liver Function Tests:  Recent Labs Lab 08/13/16 1221  AST 18  ALT 10*  ALKPHOS 58  BILITOT  1.3*  PROT 7.1  ALBUMIN 4.0   No results for input(s): LIPASE, AMYLASE in the last 168 hours. No results for input(s): AMMONIA in the last 168 hours. Coagulation Profile: No results for input(s): INR, PROTIME in the last 168 hours. Cardiac Enzymes:  Recent Labs Lab 08/13/16 1221  TROPONINI 0.03*   BNP (last 3 results) No results for input(s): PROBNP in the last 8760 hours. HbA1C: No results for input(s): HGBA1C in the last 72 hours. CBG: No results for input(s): GLUCAP in the last 168 hours. Lipid Profile: No results for input(s): CHOL, HDL, LDLCALC, TRIG, CHOLHDL, LDLDIRECT in the last 72 hours. Thyroid Function Tests: No results for input(s): TSH, T4TOTAL, FREET4, T3FREE, THYROIDAB in the last 72 hours. Anemia Panel: No results for input(s): VITAMINB12, FOLATE, FERRITIN, TIBC, IRON, RETICCTPCT in the last 72 hours. Urine analysis:    Component Value Date/Time   COLORURINE ORANGE (A) 07/02/2014 0030   APPEARANCEUR CLOUDY (A) 07/02/2014 0030   LABSPEC 1.025 07/02/2014 0030  PHURINE 6.0 07/02/2014 0030   GLUCOSEU NEGATIVE 07/02/2014 0030   HGBUR SMALL (A) 07/02/2014 0030   BILIRUBINUR SMALL (A) 07/02/2014 0030   KETONESUR NEGATIVE 07/02/2014 0030   PROTEINUR 30 (A) 07/02/2014 0030   UROBILINOGEN 1.0 07/02/2014 0030   NITRITE NEGATIVE 07/02/2014 0030   LEUKOCYTESUR NEGATIVE 07/02/2014 0030   Sepsis Labs: @LABRCNTIP (procalcitonin:4,lacticidven:4) )No results found for this or any previous visit (from the past 240 hour(s)).   Radiological Exams on Admission: Dg Chest 2 View  Result Date: 08/13/2016 CLINICAL DATA:  Shortness of breath for several months, worse today. EXAM: CHEST  2 VIEW COMPARISON:  PA and lateral chest 07/02/2014. FINDINGS: There is cardiomegaly without edema. Mild subsegmental atelectasis is seen in the left lung base. No pneumothorax or pleural effusion. No acute bony abnormality. IMPRESSION: Cardiomegaly without edema. Subsegmental atelectasis left  lung base. Electronically Signed   By: Inge Rise M.D.   On: 08/13/2016 12:53    EKG: Independently reviewed. Sinus rhythm with left bundle branch block  Assessment/Plan: Principal Problem:   Acute respiratory failure with hypoxia (HCC) Active Problems:   Essential hypertension   ETOH abuse   Acute on chronic diastolic CHF (congestive heart failure) (HCC)   Acute renal insufficiency   Elevated troponin I level    This patient was discussed with the ED physician, including pertinent vitals, physical exam findings, labs, and imaging.  We also discussed care given by the ED provider.  #1 acute respiratory failure with hypoxia  Observation on telemetry  Continue oxygen therapy #2 acute on chronic diastolic heart failure Telemetry monitoring Strict I/O Daily Weights Diuresis: Lasix 60 mg IV twice a day Potassium: 40 mEq twice a day by mouth Echo cardiac exam tomorrow Repeat BMP tomorrow Continue beta blocker Hold ACE inhibitor due to acute renal insufficiency #3 acute renal insufficiency  Hold ACE inhibitor  Check creatinine in the morning #4 elevated troponin I level  Repeat troponin once #5 essential hypertension  Start hydralazine 25 mg 3 times a day in place of ACE inhibitor #6 alcohol abuse  Alcohol withdrawal protocol  DVT prophylaxis: Lovenox Consultants: None Code Status: Full code Family Communication: Son in the room  Disposition Plan: Patient may be able to go home tomorrow if breathing status improved with diuresis   Truett Mainland, DO [facility] Pager [phone]  If 7PM-7AM, please contact night-coverage [email or web address] Password [email or web address]

## 2016-08-13 NOTE — ED Provider Notes (Signed)
[facility] DEPT Provider Note   CSN: [id number] Arrival date & time: 08/13/16  1114   By signing my name below, I, Hilbert Odor, attest that this documentation has been prepared under the direction and in the presence of Merrily Pew, MD. Electronically Signed: Hilbert Odor, Scribe. 08/13/16. 12:07 PM. History   Chief Complaint Chief Complaint  Patient presents with  . Shortness of Breath     The history is provided by the patient and a friend. No language interpreter was used.   HPI Comments: Joseph Chen is a 66 y.o. male who presents to the Emergency Department complaining of right lower lateral side chest pain that began today. Patient also reports shortness of breath for a couple of months. This shortness of breath worsens with exertion. Patient states that he went to the doctor earlier today and his oxygen saturation was noted to be 91%. Patient typically drinks 4-5 oz of liquor about 2 to 3 days a weeks. Patient states that he chews tobacco. He is a former cigar smoker. He states hx of liver problems, COPD, and CHF. Patient does not wear oxygen at home. Past Medical History:  Diagnosis Date  . Arthritis   . COPD with emphysema ([place])   . Diastolic dysfunction    Grade 2, LVEF 50-55% 2015  . Essential hypertension   . Gout   . History of pneumonia   . Secondary pulmonary hypertension    PASP 78 mmHg 2015    Patient Active Problem List   Diagnosis Date Noted  . Acute on chronic diastolic CHF (congestive heart failure) ([place]) 08/13/2016  . Acute respiratory failure with hypoxia ([place]) 08/13/2016  . Acute renal insufficiency 08/13/2016  . Elevated troponin I level 08/13/2016  . Guaiac positive stools 03/01/2016  . Pain in joint, shoulder region 06/19/2014  . ETOH abuse 06/19/2014  . Thrombocytopenia ([place]) 06/19/2014  . Morbid obesity ([place]) 06/19/2014  . Gout   . SOB (shortness of breath) 06/18/2014  . CHF with unknown LVEF ([place]) 06/18/2014  . Fever  06/18/2014  . CAP (community acquired pneumonia) 06/18/2014  . Pleural effusion, bilateral 06/18/2014  . Hypokalemia 06/18/2014  . Hypoxia 06/18/2014  . Hypertension   . Essential hypertension     Past Surgical History:  Procedure Laterality Date  . CIRCUMCISION    . COLONOSCOPY N/A 04/21/2016   Procedure: COLONOSCOPY;  Surgeon: Rogene Houston, MD;  Location: [facility];  Service: Endoscopy;  Laterality: N/A;  2:00  . dental extractions    . DENTAL SURGERY    . VASECTOMY         Home Medications    Prior to Admission medications   Medication Sig Start Date End Date Taking? Authorizing Provider  allopurinol (ZYLOPRIM) 300 MG tablet Take 300 mg by mouth daily.   Yes Historical Provider, MD  azithromycin (ZITHROMAX) 250 MG tablet Take 1 tablet by mouth daily. 08/11/16  Yes Historical Provider, MD  carvedilol (COREG) 25 MG tablet Take 25 mg by mouth 2 (two) times daily with a meal.   Yes Historical Provider, MD  furosemide (LASIX) 40 MG tablet Take 40 mg by mouth 2 (two) times daily.    Yes Historical Provider, MD  lisinopril (PRINIVIL,ZESTRIL) 40 MG tablet Take 40 mg by mouth 2 (two) times daily.   Yes Historical Provider, MD  potassium chloride (K-DUR) 10 MEQ tablet Take 1 tablet by mouth 2 (two) times daily.  07/14/16  Yes Historical Provider, MD  SPIRIVA HANDIHALER 18 MCG inhalation capsule Place 1  capsule into inhaler and inhale daily. 06/25/16  Yes Historical Provider, MD  tamsulosin (FLOMAX) 0.4 MG CAPS capsule Take 1 capsule by mouth daily. 06/11/16  Yes Historical Provider, MD  VENTOLIN HFA 108 (90 Base) MCG/ACT inhaler Inhale 2 puffs into the lungs daily as needed for shortness of breath. 05/21/16  Yes Historical Provider, MD    Family History Family History  Problem Relation Age of Onset  . Rheumatic fever Father   . Aneurysm Mother   . Stroke Mother   . Asthma Brother     Social History Social History  Substance Use Topics  . Smoking status: Former Smoker     Types: Cigarettes    Quit date: 09/17/1988  . Smokeless tobacco: Current User    Types: Chew  . Alcohol use 0.0 oz/week     Comment: 4 or more drinks a week     Allergies   Patient has no known allergies.   Review of Systems Review of Systems  Respiratory: Positive for shortness of breath.   Cardiovascular: Positive for chest pain (right lower lateral side).  Gastrointestinal: Negative for abdominal pain, nausea and vomiting.  All other systems reviewed and are negative.    Physical Exam Updated Vital Signs BP 157/76   Pulse 63   Temp 98.3 F (36.8 C) (Oral)   Resp 24   Ht 5\' 8"  (1.727 m)   Wt 251 lb (113.9 kg)   SpO2 93%   BMI 38.16 kg/m   Physical Exam  Constitutional: He is oriented to person, place, and time. No distress.  HENT:  Head: Normocephalic and atraumatic.  Eyes: Pupils are equal, round, and reactive to light.  Neck: Neck supple. No thyromegaly present.  Cardiovascular: Normal rate and regular rhythm.  Exam reveals distant heart sounds. Exam reveals no gallop and no friction rub.   No murmur heard. Mild bilateral pitting edema above his ankles.  Pulmonary/Chest: He has decreased breath sounds (bilateral bases). He has no wheezes. He has no rales.  Crackles in right base.  Abdominal: Soft. He exhibits no distension.  Musculoskeletal: Normal range of motion.  Neurological: He is alert and oriented to person, place, and time.  Skin: Skin is warm and dry. No rash noted. No erythema.  Psychiatric: He has a normal mood and affect.   ED Treatments / Results  DIAGNOSTIC STUDIES: Oxygen Saturation is 90% on 2 liters [place], low by my interpretation.    COORDINATION OF CARE: 12:07 PM Discussed treatment plan with pt at bedside and pt agreed to plan.  Labs (all labs ordered are listed, but only abnormal results are displayed) Labs Reviewed  CBC WITH DIFFERENTIAL/PLATELET - Abnormal; Notable for the following:       Result Value   HCT 52.5 (*)    MCV 105.4  (*)    Platelets 127 (*)    All other components within normal limits  COMPREHENSIVE METABOLIC PANEL - Abnormal; Notable for the following:    Chloride 95 (*)    CO2 34 (*)    Glucose, Bld 103 (*)    BUN 31 (*)    Creatinine, Ser 1.51 (*)    ALT 10 (*)    Total Bilirubin 1.3 (*)    GFR calc non Af Amer 47 (*)    GFR calc Af Amer 54 (*)    All other components within normal limits  TROPONIN I - Abnormal; Notable for the following:    Troponin I 0.03 (*)    All other components  within normal limits  BRAIN NATRIURETIC PEPTIDE - Abnormal; Notable for the following:    B Natriuretic Peptide 1,140.0 (*)    All other components within normal limits    EKG  EKG Interpretation  Date/Time:  Friday August 13 2016 11:53:21 EST Ventricular Rate:  59 PR Interval:    QRS Duration: 170 QT Interval:  494 QTC Calculation: 490 R Axis:   -52 Text Interpretation:  Sinus rhythm Left bundle branch block Baseline wander in lead(s) V4 Left bundle branch block new from 2015 Confirmed by [facility] MD, Corene Cornea [phone]) on 08/13/2016 12:24:59 PM       Radiology Dg Chest 2 View  Result Date: 08/13/2016 CLINICAL DATA:  Shortness of breath for several months, worse today. EXAM: CHEST  2 VIEW COMPARISON:  PA and lateral chest 07/02/2014. FINDINGS: There is cardiomegaly without edema. Mild subsegmental atelectasis is seen in the left lung base. No pneumothorax or pleural effusion. No acute bony abnormality. IMPRESSION: Cardiomegaly without edema. Subsegmental atelectasis left lung base. Electronically Signed   By: Inge Rise M.D.   On: 08/13/2016 12:53    Procedures Procedures (including critical care time)  Medications Ordered in ED Medications  furosemide (LASIX) injection 60 mg (not administered)  potassium chloride SA (K-DUR,KLOR-CON) CR tablet 40 mEq (not administered)  ipratropium-albuterol (DUONEB) 0.5-2.5 (3) MG/3ML nebulizer solution 3 mL (3 mLs Nebulization Given 08/13/16 1216)  furosemide  (LASIX) injection 60 mg (60 mg Intravenous Given 08/13/16 1404)     Initial Impression / Assessment and Plan / ED Course  I have reviewed the triage vital signs and the nursing notes.  Pertinent labs & imaging results that were available during my care of the patient were reviewed by me and considered in my medical decision making (see chart for details).  Clinical Course     66 year old male here with acute on chronic CHF exacerbation. Has mild lotion edema, brain natruretic peptide of over thousand, fluid on his x-ray. Also is hypoxic which is new. Attempted to diurese in the emergency department however was persistently hypoxic and significant tachypnea when he is walking. So plan for admission for diuresis.  Final Clinical Impressions(s) / ED Diagnoses   Final diagnoses:  Acute congestive heart failure, unspecified congestive heart failure type [facility])    New Prescriptions New Prescriptions   No medications on file   I personally performed the services described in this documentation, which was scribed in my presence. The recorded information has been reviewed and is accurate.    Merrily Pew, MD 08/13/16 [phone]

## 2016-08-13 NOTE — ED Notes (Signed)
Reports PCP upped his fluid meds x 2 weeks ago to take 2 per day.  Pt states he has been forgetting to take 2nd pill.

## 2016-08-13 NOTE — ED Notes (Signed)
Ambulated Pt Pt spo2 was 86 and pulse was 75 Pt walked okay but got short of breath while walking

## 2016-08-13 NOTE — ED Notes (Signed)
Critical troponin 0.03 reported to Dr Dolly Rias

## 2016-08-14 ENCOUNTER — Observation Stay (HOSPITAL_BASED_OUTPATIENT_CLINIC_OR_DEPARTMENT_OTHER): Payer: Medicare HMO

## 2016-08-14 DIAGNOSIS — J9601 Acute respiratory failure with hypoxia: Secondary | ICD-10-CM | POA: Diagnosis not present

## 2016-08-14 DIAGNOSIS — R748 Abnormal levels of other serum enzymes: Secondary | ICD-10-CM | POA: Diagnosis not present

## 2016-08-14 DIAGNOSIS — I5033 Acute on chronic diastolic (congestive) heart failure: Secondary | ICD-10-CM

## 2016-08-14 DIAGNOSIS — F1010 Alcohol abuse, uncomplicated: Secondary | ICD-10-CM

## 2016-08-14 DIAGNOSIS — I509 Heart failure, unspecified: Secondary | ICD-10-CM | POA: Diagnosis not present

## 2016-08-14 DIAGNOSIS — N289 Disorder of kidney and ureter, unspecified: Secondary | ICD-10-CM | POA: Diagnosis not present

## 2016-08-14 DIAGNOSIS — I10 Essential (primary) hypertension: Secondary | ICD-10-CM

## 2016-08-14 LAB — BASIC METABOLIC PANEL
Anion gap: 12 (ref 5–15)
BUN: 29 mg/dL — ABNORMAL HIGH (ref 6–20)
CALCIUM: 9 mg/dL (ref 8.9–10.3)
CO2: 37 mmol/L — AB (ref 22–32)
CREATININE: 1.43 mg/dL — AB (ref 0.61–1.24)
Chloride: 90 mmol/L — ABNORMAL LOW (ref 101–111)
GFR calc non Af Amer: 50 mL/min — ABNORMAL LOW (ref >60)
GFR, EST AFRICAN AMERICAN: 58 mL/min — AB (ref >60)
Glucose, Bld: 101 mg/dL — ABNORMAL HIGH (ref 65–99)
Potassium: 3.5 mmol/L (ref 3.5–5.1)
SODIUM: 139 mmol/L (ref 135–145)

## 2016-08-14 LAB — ECHOCARDIOGRAM COMPLETE
HEIGHTINCHES: 68 in
WEIGHTICAEL: 3816 [oz_av]

## 2016-08-14 MED ORDER — FUROSEMIDE 40 MG PO TABS
40.0000 mg | ORAL_TABLET | Freq: Two times a day (BID) | ORAL | Status: DC
Start: 2016-08-14 — End: 2016-08-14

## 2016-08-14 MED ORDER — HYDRALAZINE HCL 50 MG PO TABS: 50.0000 mg | ORAL_TABLET | Freq: Three times a day (TID) | ORAL | 0 refills | Status: DC

## 2016-08-14 NOTE — Discharge Instructions (Signed)
Preventing Heart Failure Heart failure is a condition in which the heart has trouble pumping blood. This may mean that the heart cannot pump enough blood out to the body, or that the heart does not fill up with enough blood. Either of those problems can lead to symptoms such as fatigue, trouble breathing, and swelling throughout the body. This is a common medical condition that affects not only the heart, but the entire body. Making certain nutrition and lifestyle changes can help you prevent heart failure and avoid serious health problems. What nutrition changes can be made?  If you are overweight or obese, reduce how many calories you eat each day so that you lose weight. Work with your health care provider or a diet and nutrition specialist (dietitian) to determine how many calories you need each day.  Eat foods that are low in salt (sodium). Avoid adding extra salt to foods.  Eat a well-balanced diet that includes a lot of:  Fresh fruits and vegetables.  Whole grains.  Lean meats.  Beans.  Fat-free or low-fat dairy products.  Avoid foods that contain a lot of:  Trans fats.  Saturated fats.  Sugar.  Cholesterol. What lifestyle changes can be made?  Do not use any products that contain nicotine or tobacco, such as cigarettes and e-cigarettes. If you need help quitting or reducing how much you smoke, ask your health care provider.  Stop using alcohol, or limit alcohol intake to no more than 1 drink a day for nonpregnant women and 2 drinks a day for men. One drink equals 12 oz of beer, 5 oz of wine, or 1 oz of hard liquor.  Exercise for at least 150 minutes each week, or as much as told by your health care provider.  Do moderate-intensity exercise, such as brisk walking, bicycling, or water aerobics.  Ask your health care provider which activities are safe for you.  See a health care provider regularly for screening and wellness checks. Know your heart health indicators,  such as:  Blood pressure.  Cholesterol levels.  Blood sugar (glucose) levels.  Weight and BMI.  If you have diabetes, manage your condition and follow your treatment plan as instructed.  Try to get 7-9 hours of sleep each night. To help with sleep:  Keep your bedroom cool and dark.  Do not eat a heavy meal during the hour before you go to bed.  Do not drink alcohol or caffeinated drinks before bed.  Avoid screen time before bedtime. This means avoiding television, computers, tablets, and cell phones.  Find ways to relax and manage stress. These may include:  Breathing exercises.  Meditation.  Yoga.  Listening to music. Why are these changes important?  A well-balanced diet with the appropriate amount of calories can keep your body weight at a healthy level, which reduces strain on your heart.  A low-sodium diet can help keep your blood pressure in a normal range and keep your blood vessels working properly.  Quitting smoking and limiting alcohol intake can reduce harmful effects that these substances have on your heart and blood vessels.  Regular exercise can keep your heart strong so it can pump blood normally.  Managing diabetes helps your blood circulate and can help you maintain a healthy weight.  Managing stress helps to reduce the risk of high blood pressure and heart problems. What can happen if changes are not made? Heart failure can cause very serious problems that may get worse over time, such as:  Extreme  fatigue during normal physical activities.  Shortness of breath or trouble breathing.  Swelling in your abdomen, legs, ankles, feet, or neck.  Fluid buildup throughout the body.  Weight gain.  Cough.  Frequent urination. What can I do to lower my risk? You may be able to lower your risk of heart failure by:  Losing weight or keeping your weight under control.  Working with your health care provider to manage your:  Cholesterol.  Blood  pressure.  Diabetes, if this applies.  Eating a healthy diet.  Exercising regularly.  Avoiding unhealthy habits, such as smoking, drinking, or using drugs.  Getting plenty of sleep.  Managing your stress. How is this treated? Heart failure cannot be cured except by heart transplant, but treatment can help to improve your quality of life. Treatment may include:  Medicines to help:  Lower blood pressure.  Remove excess sodium from your body.  Relax blood vessels.  Improve heart function.  Control other symptoms of heart failure.  Surgery to open blocked coronary arteries or repair damaged heart valves.  Implantation of a biventricular pacemaker to improve heart muscle function (cardiac resynchronization therapy). This device paces both the right ventricle and left ventricle.  Implantation of a device to treat serious abnormal heart rhythms (implantable cardioverter defibrillator, ICD).  Implantation of a mechanical heart pump to improve the pumping ability of your heart (left ventricular assist device, LVAD).  Heart transplant. This treatment is considered for certain people who do not improve with other treatments. Where to find more information:  National Heart, Lung, and Blood Institute: ClickDebate.gl  Centers for Disease Control and Prevention: LawyerNetworking.com.cy  NIH Senior Health: https://www.[place].info/  American Heart Association: ReligiousCamps.at.jsp Contact a health care provider if:  You have rapid weight gain.  You have increasing shortness of breath that is unusual for you.  You tire easily, or you are unable to participate in your usual activities.  You cough more than normal, especially with physical activity.  You have any swelling or more swelling in areas such  as your hands, feet, ankles, or abdomen. Summary  Heart failure can be prevented by making changes to your diet and your lifestyle.  It is important to eat a healthy diet, manage your weight, exercise regularly, manage stress, avoid drugs and alcohol, and keep your cholesterol and blood pressure under control.  Heart failure can cause very serious problems over time. This information is not intended to replace advice given to you by your health care provider. Make sure you discuss any questions you have with your health care provider. Document Released: 03/16/2016 Document Revised: 03/16/2016 Document Reviewed: 03/16/2016 Elsevier Interactive Patient Education  2017 [place].   Heart Failure Heart failure means your heart has trouble pumping blood. This makes it hard for your body to work well. Heart failure is usually a long-term (chronic) condition. You must take good care of yourself and follow your doctor's treatment plan. HOME CARE  Take your heart medicine as told by your doctor.  Do not stop taking medicine unless your doctor tells you to.  Do not skip any dose of medicine.  Refill your medicines before they run out.  Take other medicines only as told by your doctor or pharmacist.  Stay active if told by your doctor. The elderly and people with severe heart failure should talk with a doctor about physical activity.  Eat heart-healthy foods. Choose foods that are without trans fat and are low in saturated fat, cholesterol, and salt (sodium). This includes  fresh or frozen fruits and vegetables, fish, lean meats, fat-free or low-fat dairy foods, whole grains, and high-fiber foods. Lentils and dried peas and beans (legumes) are also good choices.  Limit salt if told by your doctor.  Cook in a healthy way. Roast, grill, broil, bake, poach, steam, or stir-fry foods.  Limit fluids as told by your doctor.  Weigh yourself every morning. Do this after you pee (urinate) and  before you eat breakfast. Write down your weight to give to your doctor.  Take your blood pressure and write it down if your doctor tells you to.  Ask your doctor how to check your pulse. Check your pulse as told.  Lose weight if told by your doctor.  Stop smoking or chewing tobacco. Do not use gum or patches that help you quit without your doctor's approval.  Schedule and go to doctor visits as told.  Nonpregnant women should have no more than 1 drink a day. Men should have no more than 2 drinks a day. Talk to your doctor about drinking alcohol.  Stop illegal drug use.  Stay current with shots (immunizations).  Manage your health conditions as told by your doctor.  Learn to manage your stress.  Rest when you are tired.  If it is really hot outside:  Avoid intense activities.  Use air conditioning or fans, or get in a cooler place.  Avoid caffeine and alcohol.  Wear loose-fitting, lightweight, and light-colored clothing.  If it is really cold outside:  Avoid intense activities.  Layer your clothing.  Wear mittens or gloves, a hat, and a scarf when going outside.  Avoid alcohol.  Learn about heart failure and get support as needed.  Get help to maintain or improve your quality of life and your ability to care for yourself as needed. GET HELP IF:   You gain weight quickly.  You are more short of breath than usual.  You cannot do your normal activities.  You tire easily.  You cough more than normal, especially with activity.  You have any or more puffiness (swelling) in areas such as your hands, feet, ankles, or belly (abdomen).  You cannot sleep because it is hard to breathe.  You feel like your heart is beating fast (palpitations).  You get dizzy or light-headed when you stand up. GET HELP RIGHT AWAY IF:   You have trouble breathing.  There is a change in mental status, such as becoming less alert or not being able to focus.  You have chest pain  or discomfort.  You faint. MAKE SURE YOU:   Understand these instructions.  Will watch your condition.  Will get help right away if you are not doing well or get worse. This information is not intended to replace advice given to you by your health care provider. Make sure you discuss any questions you have with your health care provider. Document Released: 05/04/2008 Document Revised: 08/16/2014 Document Reviewed: 09/11/2012 Elsevier Interactive Patient Education  2017 [employer].

## 2016-08-14 NOTE — Progress Notes (Signed)
*  PRELIMINARY RESULTS* Echocardiogram 2D Echocardiogram has been performed.  Joseph Chen 08/14/2016, 11:09 AM

## 2016-08-14 NOTE — Progress Notes (Signed)
Patient is to be discharged home and in stable condition. IV and telemetry removed. Discharge instructions discussed, patient verbalized understanding. Patient has no further questions at this time. Patient is dressing independently and awaiting transportation home.  Celestia Khat, RN

## 2016-08-14 NOTE — Discharge Summary (Signed)
Physician Discharge Summary  Joseph Chen [id number] DOB: 10/31/1950 DOA: 08/13/2016  PCP: Antionette Fairy, PA-C  Admit date: 08/13/2016 Discharge date: 08/14/2016  Admitted From: Home  Disposition: Home   Recommendations for Outpatient Follow-up:  1. Follow up with PCP in 5-7 days 2. Please obtain BMP/CBC on follow up  3. Please follow up on the following pending results: Echocardiogram.  4. Please refer to cardiologist  Discharge Condition: STABLE  CODE STATUS:FULL  Diet recommendation: Heart Healthy   Brief/Interim Summary: HPI: Joseph Chen is a 66 y.o. male with a history of chronic diastolic heart failure grade 1 with normal EF on echocardiogram December 2016, COPD with emphysema, gout, hypertension, secondary pulmonary hypertension. Patient presents with worsening shortness of breath. The patient states that he has been increasingly short of breath over the past few months, but worse over the past 2-3 weeks. He has been working with his primary care physician to improve his breathing, however he has been unsuccessful. His PCP increased his Lasix from 40mg  once a day to 40 mg twice daily, which occurred a couple weeks ago, however the patient has been forgetting to take his afternoon dose. He notes some swelling in his legs bilaterally and notes that his breathing is worse when he is walking around and improved at rest. Denies increased salt intake, orthopnea, cough, wheezing, fevers.  Emergency Department Course: Elevated proBNP and troponin 0.03 with chest x-ray suggestive of failure on my read. Patient received a dose of IV Lasix in the emergency department with good diuresis, however patient still hypoxic when ambulating to the mid 80s.  Acute on Chronic diastolic heart failure - Pt feels much better after diuresis and wants to go home. Says he hadn't been taking lasix as prescribed and only taking once per day.  Resume lasix 40 mg po BID, Potassium supplement.  Holding ACEI  due to CKD, PCP to reassess and decide if he can restart lisinopril.  Hydralazine 50 mg TID ordered for blood pressure.   2D Echocardiogram was done but results pending. No cardiology over weekend at this facility.  PCP to follow up results.  Recommend referral to cardiologist outpatient.   CKD - creatinine slightly improved with diuresis, recheck with PCP in 1 week.   Hypertension - hydralazine 50 mg TID ordered, close follow up with PCP.   Alcohol Abuse - counseled at bedside, follow up with PCP.   Discharge Diagnoses:  Principal Problem:   Acute respiratory failure with hypoxia (HCC) Active Problems:   Essential hypertension   ETOH abuse   Acute on chronic diastolic CHF (congestive heart failure) (HCC)   Acute renal insufficiency   Elevated troponin I level  Discharge Instructions  Discharge Instructions    Diet - low sodium heart healthy    Complete by:  As directed    Increase activity slowly    Complete by:  As directed      Allergies as of 08/14/2016   No Known Allergies     Medication List    STOP taking these medications   lisinopril 40 MG tablet Commonly known as:  PRINIVIL,ZESTRIL     TAKE these medications   allopurinol 300 MG tablet Commonly known as:  ZYLOPRIM Take 300 mg by mouth daily.   azithromycin 250 MG tablet Commonly known as:  ZITHROMAX Take 1 tablet by mouth daily.   carvedilol 25 MG tablet Commonly known as:  COREG Take 25 mg by mouth 2 (two) times daily with a meal.   furosemide 40  MG tablet Commonly known as:  LASIX Take 40 mg by mouth 2 (two) times daily.   hydrALAZINE 50 MG tablet Commonly known as:  APRESOLINE Take 1 tablet (50 mg total) by mouth 3 (three) times daily.   potassium chloride 10 MEQ tablet Commonly known as:  K-DUR Take 1 tablet by mouth 2 (two) times daily.   SPIRIVA HANDIHALER 18 MCG inhalation capsule Generic drug:  tiotropium Place 1 capsule into inhaler and inhale daily.   tamsulosin 0.4 MG Caps  capsule Commonly known as:  FLOMAX Take 1 capsule by mouth daily.   VENTOLIN HFA 108 (90 Base) MCG/ACT inhaler Generic drug:  albuterol Inhale 2 puffs into the lungs daily as needed for shortness of breath.      Follow-up Information    BAUCOM, JENNY B, PA-C. Schedule an appointment as soon as possible for a visit in 5 day(s).   Specialty:  Physician Assistant Why:  Hospital Follow Up Contact information: [address] [place] [address] [phone]          No Known Allergies   Procedures/Studies: Dg Chest 2 View  Result Date: 08/13/2016 CLINICAL DATA:  Shortness of breath for several months, worse today. EXAM: CHEST  2 VIEW COMPARISON:  PA and lateral chest 07/02/2014. FINDINGS: There is cardiomegaly without edema. Mild subsegmental atelectasis is seen in the left lung base. No pneumothorax or pleural effusion. No acute bony abnormality. IMPRESSION: Cardiomegaly without edema. Subsegmental atelectasis left lung base. Electronically Signed   By: Inge Rise M.D.   On: 08/13/2016 12:53    (Echo, Carotid, EGD, Colonoscopy, ERCP)    Subjective: Pt says he fells  Much better.  He diuresed well overnight.  Feet and legs not swollen any longer. Wants to go home.   Discharge Exam: Vitals:   08/14/16 0515 08/14/16 0808  BP: (!) 179/85   Pulse: 80 74  Resp:    Temp:     Vitals:   08/14/16 0400 08/14/16 0515 08/14/16 0730 08/14/16 0808  BP: (!) 179/86 (!) 179/85    Pulse: 81 80  74  Resp: 18     Temp: 97.7 F (36.5 C)     TempSrc: Oral     SpO2: 92%  94%   Weight:  108.2 kg (238 lb 8 oz)    Height:       General: Pt is alert, awake, not in acute distress Cardiovascular: RRR, S1/S2 +, no rubs, no gallops Respiratory: CTA bilaterally, no wheezing, no rhonchi Abdominal: Soft, NT, ND, bowel sounds + Extremities: trace pedal edema, no cyanosis  The results of significant diagnostics from this hospitalization (including imaging, microbiology, ancillary  and laboratory) are listed below for reference.     Microbiology: No results found for this or any previous visit (from the past 240 hour(s)).   Labs: BNP (last 3 results)  Recent Labs  08/13/16 1221  BNP 99991111*   Basic Metabolic Panel:  Recent Labs Lab 08/13/16 1221 08/14/16 0641  NA 139 139  K 3.6 3.5  CL 95* 90*  CO2 34* 37*  GLUCOSE 103* 101*  BUN 31* 29*  CREATININE 1.51* 1.43*  CALCIUM 9.3 9.0   Liver Function Tests:  Recent Labs Lab 08/13/16 1221  AST 18  ALT 10*  ALKPHOS 58  BILITOT 1.3*  PROT 7.1  ALBUMIN 4.0   No results for input(s): LIPASE, AMYLASE in the last 168 hours. No results for input(s): AMMONIA in the last 168 hours. CBC:  Recent Labs Lab  08/13/16 1221  WBC 8.9  NEUTROABS 6.5  HGB 16.8  HCT 52.5*  MCV 105.4*  PLT 127*   Cardiac Enzymes:  Recent Labs Lab 08/13/16 1221 08/13/16 1622  TROPONINI 0.03* 0.03*   BNP: Invalid input(s): POCBNP CBG: No results for input(s): GLUCAP in the last 168 hours. D-Dimer No results for input(s): DDIMER in the last 72 hours. Hgb A1c No results for input(s): HGBA1C in the last 72 hours. Lipid Profile No results for input(s): CHOL, HDL, LDLCALC, TRIG, CHOLHDL, LDLDIRECT in the last 72 hours. Thyroid function studies No results for input(s): TSH, T4TOTAL, T3FREE, THYROIDAB in the last 72 hours.  Invalid input(s): FREET3 Anemia work up No results for input(s): VITAMINB12, FOLATE, FERRITIN, TIBC, IRON, RETICCTPCT in the last 72 hours. Urinalysis    Component Value Date/Time   COLORURINE ORANGE (A) 07/02/2014 0030   APPEARANCEUR CLOUDY (A) 07/02/2014 0030   LABSPEC 1.025 07/02/2014 0030   PHURINE 6.0 07/02/2014 0030   GLUCOSEU NEGATIVE 07/02/2014 0030   HGBUR SMALL (A) 07/02/2014 0030   BILIRUBINUR SMALL (A) 07/02/2014 0030   KETONESUR NEGATIVE 07/02/2014 0030   PROTEINUR 30 (A) 07/02/2014 0030   UROBILINOGEN 1.0 07/02/2014 0030   NITRITE NEGATIVE 07/02/2014 0030   LEUKOCYTESUR  NEGATIVE 07/02/2014 0030   Sepsis Labs Invalid input(s): PROCALCITONIN,  WBC,  LACTICIDVEN Microbiology No results found for this or any previous visit (from the past 240 hour(s)).  Time coordinating discharge: Over 30 minutes  SIGNED:  Irwin Brakeman, MD  [facility] 08/14/2016, 12:06 PM Pager   If 7PM-7AM, please contact night-coverage [email or web address] Password [email or web address]

## 2016-08-16 ENCOUNTER — Ambulatory Visit (HOSPITAL_COMMUNITY)
Admission: RE | Admit: 2016-08-16 | Discharge: 2016-08-16 | Disposition: A | Discharge status: Home / Self Care | Payer: Medicare HMO | Source: Ambulatory Visit | Attending: Urology | Admitting: Urology

## 2016-08-16 DIAGNOSIS — K5730 Diverticulosis of large intestine without perforation or abscess without bleeding: Secondary | ICD-10-CM | POA: Diagnosis not present

## 2016-08-16 DIAGNOSIS — I2510 Atherosclerotic heart disease of native coronary artery without angina pectoris: Secondary | ICD-10-CM | POA: Diagnosis not present

## 2016-08-16 DIAGNOSIS — N281 Cyst of kidney, acquired: Secondary | ICD-10-CM | POA: Insufficient documentation

## 2016-08-16 DIAGNOSIS — R310 Gross hematuria: Secondary | ICD-10-CM | POA: Insufficient documentation

## 2016-08-16 DIAGNOSIS — K8020 Calculus of gallbladder without cholecystitis without obstruction: Secondary | ICD-10-CM | POA: Insufficient documentation

## 2016-08-16 MED ORDER — IOPAMIDOL (ISOVUE-300) INJECTION 61%
125.0000 mL | Freq: Once | INTRAVENOUS | Status: AC | PRN
  Administered 2016-08-16: 125 mL via INTRAVENOUS

## 2016-09-01 ENCOUNTER — Ambulatory Visit (INDEPENDENT_AMBULATORY_CARE_PROVIDER_SITE_OTHER): Payer: Medicare HMO | Admitting: Urology

## 2016-09-01 DIAGNOSIS — R310 Gross hematuria: Secondary | ICD-10-CM | POA: Diagnosis not present

## 2017-03-02 ENCOUNTER — Ambulatory Visit: Payer: Medicare HMO | Admitting: Urology

## 2017-11-18 ENCOUNTER — Ambulatory Visit: Payer: Medicare HMO | Admitting: Cardiology

## 2017-11-18 ENCOUNTER — Encounter: Payer: Self-pay | Admitting: Cardiology

## 2017-11-18 ENCOUNTER — Encounter: Payer: Self-pay | Admitting: *Deleted

## 2017-11-18 ENCOUNTER — Telehealth: Payer: Self-pay | Admitting: Cardiology

## 2017-11-18 VITALS — BP 138/79 | HR 64 | Ht 68.0 in | Wt 251.6 lb

## 2017-11-18 DIAGNOSIS — J439 Emphysema, unspecified: Secondary | ICD-10-CM

## 2017-11-18 DIAGNOSIS — R0789 Other chest pain: Secondary | ICD-10-CM | POA: Diagnosis not present

## 2017-11-18 DIAGNOSIS — I351 Nonrheumatic aortic (valve) insufficiency: Secondary | ICD-10-CM | POA: Diagnosis not present

## 2017-11-18 DIAGNOSIS — I10 Essential (primary) hypertension: Secondary | ICD-10-CM | POA: Diagnosis not present

## 2017-11-18 DIAGNOSIS — R0609 Other forms of dyspnea: Secondary | ICD-10-CM

## 2017-11-18 NOTE — Progress Notes (Signed)
Cardiology Office Note  Date: 11/18/2017   ID: Joseph Chen, DOB 1950/08/31, MRN [id number]  PCP: Joseph Chen  Primary Cardiologist: Joseph Lesches, MD   Chief Complaint  Patient presents with  . Chest Pain    History of Present Illness: Joseph Chen is a 67 y.o. male seen in consultation back in February 2017 and referred back to the office by Joseph Chen for evaluation of chest pain.  He describes fairly brief episodes of sharp chest pain on the left side, no obvious precipitant.  He does not report any progressive wheezing or cough recently and uses his inhalers very intermittently.  At this time he is having NYHA class II dyspnea, no palpitations or syncope.  He states that he is limited by chronic back and left hip pain.  He has not undergone any prior ischemic testing.  Cardiac risk factors include age and gender as well as hypertension.  He has a remote history of tobacco use.  I went over his current medications which include Coreg, Norvasc, lisinopril, and Pravachol.  Echocardiogram from January 2018 showed LVEF 55-60% with grade 2 diastolic dysfunction.  He has a mildly dilated aortic root at 43 mm.   Past Medical History:  Diagnosis Date  . Arthritis   . COPD with emphysema (Joseph Chen)   . Diastolic dysfunction    Grade 2, LVEF 50-55% 2015  . Essential hypertension   . Gout   . History of pneumonia   . Secondary pulmonary hypertension    PASP 78 mmHg 2015    Past Surgical History:  Procedure Laterality Date  . CIRCUMCISION    . COLONOSCOPY N/A 04/21/2016   Procedure: COLONOSCOPY;  Surgeon: Joseph Houston, MD;  [facility];  Service: Endoscopy;  Laterality: N/A;  2:00  . dental extractions    . DENTAL SURGERY    . VASECTOMY      Current Outpatient Medications  Medication Sig Dispense Refill  . allopurinol (ZYLOPRIM) 300 MG tablet Take 300 mg by mouth daily.    Marland Kitchen amLODipine (NORVASC) 5 MG tablet Take 5 mg by mouth  daily.    . carvedilol (COREG) 25 MG tablet Take 25 mg by mouth 2 (two) times daily with a meal.    . furosemide (LASIX) 40 MG tablet Take 40 mg by mouth 2 (two) times daily.     Marland Kitchen lisinopril (PRINIVIL,ZESTRIL) 40 MG tablet Take 40 mg by mouth daily.    . pravastatin (PRAVACHOL) 40 MG tablet Take 40 mg by mouth daily.    Marland Kitchen SPIRIVA HANDIHALER 18 MCG inhalation capsule Place 1 capsule into inhaler and inhale daily.    . tamsulosin (FLOMAX) 0.4 MG CAPS capsule Take 1 capsule by mouth daily.    . VENTOLIN HFA 108 (90 Base) MCG/ACT inhaler Inhale 2 puffs into the lungs daily as needed for shortness of breath.     No current facility-administered medications for this visit.    Allergies:  Patient has no known allergies.   Social History: The patient  reports that he quit smoking about 29 years ago. His smoking use included cigarettes. His smokeless tobacco use includes chew. He reports that he drinks alcohol. He reports that he does not use drugs.   Family History: The patient's family history includes Aneurysm in his mother; Asthma in his brother; Rheumatic fever in his father; Stroke in his mother.   ROS:  Please see the history of present illness. Otherwise, complete review of systems is  positive for chronic left hip and back pain.  All other systems are reviewed and negative.   Physical Exam: VS:  BP 138/79   Pulse 64   Ht 5\' 8"  (1.727 m)   Wt 251 lb 9.6 oz (114.1 kg)   SpO2 91%   BMI 38.26 kg/m , BMI Body mass index is 38.26 kg/m.  Wt Readings from Last 3 Encounters:  11/18/17 251 lb 9.6 oz (114.1 kg)  08/14/16 238 lb 8 oz (108.2 kg)  06/21/16 240 lb (108.9 kg)    General: Patient appears comfortable at rest. HEENT: Conjunctiva and lids normal, oropharynx clear, bearded. Neck: Supple, no elevated JVP or carotid bruits, no thyromegaly. Lungs: No active wheezing or rales, nonlabored breathing at rest. Cardiac: Regular rate and rhythm, no S3, soft systolic murmur. Abdomen: Soft,  nontender, bowel sounds present, no guarding or rebound. Extremities: Trace ankle edema, distal pulses 1-2+. Skin: Warm and dry. Musculoskeletal: No kyphosis. Neuropsychiatric: Alert and oriented x3, affect grossly appropriate.  ECG: I personally reviewed the tracing from 08/13/2016 which showed sinus rhythm with left bundle branch block.  Recent Labwork:  January 2018: Potassium 3.5, BUN 29, creatinine 1.43, BNP 1140, hemoglobin 16.8, platelets 127  Other Studies Reviewed Today:  Chest x-ray 08/13/2016: FINDINGS: There is cardiomegaly without edema. Mild subsegmental atelectasis is seen in the left lung base. No pneumothorax or pleural effusion. No acute bony abnormality.  IMPRESSION: Cardiomegaly without edema.  Subsegmental atelectasis left lung base.  Echocardiogram 08/14/2016: Study Conclusions  - Left ventricle: The cavity size was mildly dilated. There was   moderate focal basal and mild concentric hypertrophy. Systolic   function was normal. The estimated ejection fraction was in the   range of 55% to 60%. Wall motion was normal; there were no   regional wall motion abnormalities. Features are consistent with   a pseudonormal left ventricular filling pattern, with concomitant   abnormal relaxation and increased filling pressure (grade 2   diastolic dysfunction). - Aortic valve: Trileaflet; mildly thickened, mildly calcified   leaflets. There was mild regurgitation. - Aorta: Aortic root dimension: 43 mm (ED). - Aortic root: The aortic root was mildly dilated. - Pulmonic valve: There was trivial regurgitation.  Assessment and Plan:  1.  Atypical chest pain in the setting of known COPD and chronic diastolic heart failure.  ECG shows left bundle branch block.  Cardiac risk factors include age and gender as well as hypertension, he has a more remote history of tobacco use.  He has not undergone any previous ischemic evaluations.  We will obtain a [place] for  further assessment.  2.  Aortic regurgitation, mild by last echocardiogram and associated with mild aortic root dilatation of 43 mm.  He is asymptomatic.  3.  COPD, follows with PCP and reports no recent exacerbations.  4.  Essential hypertension, currently on Norvasc, Coreg, and lisinopril.  Current medicines were reviewed with the patient today.   Orders Placed This Encounter  Procedures  . NM Myocar Multi W/Spect W/Wall Motion / EF    Disposition: Call with test results.  Signed, Satira Sark, MD, [facility] 11/18/2017 1:20 PM    [facility] [place], [place], [place] [address] Phone: [phone]; Fax: [phone]

## 2017-11-18 NOTE — Telephone Encounter (Signed)
Lexiscan Myoview on medications dx: atypical chest pain & DOE Scheduled at [facility] April 22,2019

## 2017-11-18 NOTE — Patient Instructions (Signed)
Medication Instructions:   Your physician recommends that you continue on your current medications as directed. Please refer to the Current Medication list given to you today.  Labwork:  NONE  Testing/Procedures: Your physician has requested that you have a lexiscan myoview. For further information please visit [email or web address]. Please follow instruction sheet, as given.  Follow-Up:  Your physician recommends that you schedule a follow-up appointment in: pending test results  Any Other Special Instructions Will Be Listed Below (If Applicable).  If you need a refill on your cardiac medications before your next appointment, please call your pharmacy. 

## 2017-11-28 ENCOUNTER — Encounter (HOSPITAL_COMMUNITY)
Admission: RE | Admit: 2017-11-28 | Discharge: 2017-11-28 | Disposition: A | Discharge status: Home / Self Care | Payer: Medicare HMO | Source: Ambulatory Visit | Attending: Cardiology | Admitting: Cardiology

## 2017-11-28 ENCOUNTER — Encounter (HOSPITAL_BASED_OUTPATIENT_CLINIC_OR_DEPARTMENT_OTHER)
Admission: RE | Admit: 2017-11-28 | Discharge: 2017-11-28 | Disposition: A | Discharge status: Home / Self Care | Payer: Medicare HMO | Source: Ambulatory Visit | Attending: Cardiology | Admitting: Cardiology

## 2017-11-28 ENCOUNTER — Encounter (HOSPITAL_COMMUNITY): Payer: Self-pay

## 2017-11-28 DIAGNOSIS — R0609 Other forms of dyspnea: Secondary | ICD-10-CM | POA: Insufficient documentation

## 2017-11-28 DIAGNOSIS — R0789 Other chest pain: Secondary | ICD-10-CM | POA: Diagnosis not present

## 2017-11-28 HISTORY — DX: Unspecified asthma, uncomplicated: J45.909

## 2017-11-28 LAB — NM MYOCAR MULTI W/SPECT W/WALL MOTION / EF
LV dias vol: 162 mL (ref 62–150)
LV sys vol: 86 mL
Peak HR: 81 {beats}/min
RATE: 0.37
Rest HR: 59 {beats}/min
SDS: 1
SRS: 6
SSS: 7
TID: 1.17

## 2017-11-28 MED ORDER — REGADENOSON 0.4 MG/5ML IV SOLN
INTRAVENOUS | Status: AC
  Administered 2017-11-28: 0.4 mg via INTRAVENOUS
  Filled 2017-11-28: qty 5

## 2017-11-28 MED ORDER — TECHNETIUM TC 99M TETROFOSMIN IV KIT
10.0000 | PACK | Freq: Once | INTRAVENOUS | Status: AC | PRN
  Administered 2017-11-28: 11 via INTRAVENOUS

## 2017-11-28 MED ORDER — TECHNETIUM TC 99M TETROFOSMIN IV KIT
30.0000 | PACK | Freq: Once | INTRAVENOUS | Status: AC | PRN
  Administered 2017-11-28: 26.6 via INTRAVENOUS

## 2017-11-28 MED ORDER — SODIUM CHLORIDE 0.9% FLUSH
INTRAVENOUS | Status: AC
  Administered 2017-11-28: 10 mL via INTRAVENOUS
  Filled 2017-11-28: qty 10

## 2017-11-29 ENCOUNTER — Telehealth: Payer: Self-pay | Admitting: Cardiology

## 2017-11-29 ENCOUNTER — Telehealth: Payer: Self-pay | Admitting: *Deleted

## 2017-11-29 DIAGNOSIS — I509 Heart failure, unspecified: Secondary | ICD-10-CM

## 2017-11-29 NOTE — Telephone Encounter (Signed)
-----   Message from Satira Sark, MD sent at 11/28/2017  3:08 PM EDT ----- Results reviewed.  Stress test was low risk as outlined without definite ischemia.  LVEF was calculated at 47% but visually looks normal.  Suggest we get a follow-up echocardiogram in comparison to last year's study.  Otherwise if LVEF remains normal, no additional cardiac testing for now. A copy of this test should be forwarded to [facility].

## 2017-11-29 NOTE — Telephone Encounter (Signed)
Pt aware and agreeable to echo - will place orders and forward to schedulers - routed stress test to pcp

## 2017-11-29 NOTE — Telephone Encounter (Signed)
Echo schedule May 13,2019 in [facility]

## 2017-12-01 ENCOUNTER — Telehealth: Payer: Self-pay | Admitting: *Deleted

## 2017-12-01 NOTE — Telephone Encounter (Signed)
-----   Message from Weston Anna sent at 11/29/2017 12:17 PM EDT ----- Dec 21, 2017 @ 1:30  ----- Message ----- From: Massie Maroon, CMA Sent: 11/29/2017  11:09 AM To: Weston Anna  Can you please schedule echo for this pt - I can call with appt time.   Mikolaj Woolstenhulme

## 2017-12-01 NOTE — Telephone Encounter (Signed)
Pt aware and confirmed echo appt

## 2017-12-21 ENCOUNTER — Other Ambulatory Visit: Payer: Self-pay

## 2017-12-21 ENCOUNTER — Ambulatory Visit (INDEPENDENT_AMBULATORY_CARE_PROVIDER_SITE_OTHER): Payer: Medicare HMO

## 2017-12-21 ENCOUNTER — Telehealth: Payer: Self-pay | Admitting: *Deleted

## 2017-12-21 DIAGNOSIS — I509 Heart failure, unspecified: Secondary | ICD-10-CM

## 2017-12-21 NOTE — Telephone Encounter (Signed)
-----   Message from Satira Sark, MD sent at 12/21/2017  3:35 PM EDT ----- Results reviewed.  LVEF low normal range at 50 to 55%.  This clarifies Myoview findings in terms of LVEF. A copy of this test should be forwarded to [facility].

## 2017-12-21 NOTE — Telephone Encounter (Signed)
Patient returned call

## 2017-12-22 NOTE — Telephone Encounter (Signed)
Patient notified. Routed to PCP 

## 2018-08-29 ENCOUNTER — Ambulatory Visit: Payer: Medicare HMO | Admitting: Orthopaedic Surgery

## 2018-08-29 ENCOUNTER — Encounter: Payer: Self-pay | Admitting: Orthopaedic Surgery

## 2018-08-29 VITALS — BP 92/56 | HR 65 | Ht 66.0 in | Wt 240.0 lb

## 2018-08-29 DIAGNOSIS — M19112 Post-traumatic osteoarthritis, left shoulder: Secondary | ICD-10-CM
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
## 2018-09-05 ENCOUNTER — Other Ambulatory Visit: Payer: Self-pay

## 2018-09-05 ENCOUNTER — Encounter (HOSPITAL_COMMUNITY): Payer: Self-pay

## 2018-09-05 ENCOUNTER — Ambulatory Visit (HOSPITAL_COMMUNITY): Payer: Medicare HMO | Attending: Family Medicine

## 2018-09-05 DIAGNOSIS — G8929 Other chronic pain: Secondary | ICD-10-CM

## 2018-09-05 DIAGNOSIS — M545 Low back pain, unspecified: Secondary | ICD-10-CM

## 2018-09-05 DIAGNOSIS — R262 Difficulty in walking, not elsewhere classified: Secondary | ICD-10-CM | POA: Diagnosis present

## 2018-09-05 DIAGNOSIS — M25552 Pain in left hip: Secondary | ICD-10-CM

## 2018-09-05 NOTE — Therapy (Signed)
[place], [place], [address] Phone: [phone]   Fax:  [phone]  Physical Therapy Evaluation  Patient Details  Name: Joseph Chen MRN: [id number] Date of Birth: Dec 31, 1950 Referring Provider (PT): Curtis Sites, MD   Encounter Date: 09/05/2018  PT End of Session - 09/05/18 1125    Visit Number  1    Number of Visits  5    Date for PT Re-Evaluation  10/03/18    Authorization Type  [employer] H[employer]    Authorization Time Period  09/05/18 to 10/03/18    Authorization - Visit Number  1    Authorization - Number of Visits  10    PT Start Time  1194    PT Stop Time  0938    PT Time Calculation (min)  41 min    Activity Tolerance  Patient limited by pain    Behavior During Therapy  [facility] for tasks assessed/performed       Past Medical History:  Diagnosis Date  . Arthritis   . Asthma    Childhood  . COPD with emphysema ([place])   . Diastolic dysfunction    Grade 2, LVEF 50-55% 2015  . Essential hypertension   . Gout   . History of pneumonia   . Secondary pulmonary hypertension    PASP 78 mmHg 2015    Past Surgical History:  Procedure Laterality Date  . CIRCUMCISION    . COLONOSCOPY N/A 04/21/2016   Procedure: COLONOSCOPY;  Surgeon: Rogene Houston, MD;  [facility];  Service: Endoscopy;  Laterality: N/A;  2:00  . dental extractions    . DENTAL SURGERY    . VASECTOMY      There were no vitals filed for this visit.   Subjective Assessment - 09/05/18 0903    Subjective  Pt reports having LBP for the last few months to a year, but it has been worsening over few months. He states that his pain started when he was riding his [place] and bounced over uneven terrain and landed funny; it initially got better but it has since worsened. His pain is located center to left and goes into his L buttcheek with minor pain into his R one as well. He describes it as dull, achy pain in his back and into  his buttock as well. Movement aggravates, unsure of relieving factors. No b/b issues since this started. He has never had this pain before. No surgeries, hip or ankle issues.     Limitations  Walking;Standing;House hold activities;Sitting    How long can you sit comfortably?  if he can get comfortable, a while    How long can you stand comfortably?  a few mins    How long can you walk comfortably?  25yards comfortably    Patient Stated Goals  reduce pain in back    Currently in Pain?  Yes    Pain Score  8     Pain Location  Back    Pain Orientation  Lower;Left    Pain Descriptors / Indicators  Aching;Dull    Pain Type  Chronic pain    Pain Onset  More than a month ago    Pain Frequency  Intermittent    Aggravating Factors   movement    Pain Relieving Factors  finding a comfortable position    Effect of Pain on Daily Activities  increases         [facility] PT Assessment -  09/05/18 0001      Assessment   Medical Diagnosis  LBP    Referring Provider (PT)  Curtis Sites, MD    Onset Date/Surgical Date  --   last Fall or Spring   Hand Dominance  Right    Next MD Visit  10/05/18    Prior Therapy  none      Balance Screen   Has the patient fallen in the past 6 months  No    Has the patient had a decrease in activity level because of a fear of falling?   No    Is the patient reluctant to leave their home because of a fear of falling?   No      Prior Function   Level of Independence  Independent    Vocation  Retired    Leisure  riding his [place]      Observation/Other Assessments   Focus on Therapeutic Outcomes (FOTO)   to be performed next visit      Sensation   Light Touch  Appears Intact   BLE dermatomes   Additional Comments  DTRs WNL with jendrassik maneuver      ROM / Strength   AROM / PROM / Strength  AROM;Strength      AROM   AROM Assessment Site  Lumbar    Lumbar Flexion  60; RFIS x2 reps increased L sided buttock pain    Lumbar Extension  10; REIS x2 reps  increased L sided buttock pain    Lumbar - Right Side Bend  15, increased R side pain    Lumbar - Left Side Bend  14, increased L side pain    Lumbar - Right Rotation  25% limited    Lumbar - Left Rotation  25-50% limited      Strength   Overall Strength Comments  myotomes WNL as illustrated below    Strength Assessment Site  Hip;Knee;Ankle    Right Hip Flexion  5/5    Right Hip ABduction  4+/5    Left Hip Flexion  5/5    Left Hip ABduction  4/5    Right Knee Extension  5/5    Left Knee Extension  5/5    Right Ankle Dorsiflexion  5/5    Left Ankle Dorsiflexion  5/5      Palpation   Spinal mobility  unable to officially assess but pt grossly hypomobile    Palpation comment  only able to assess in standing -- palpatoin to glute max and deeper structures recreated his same pain      Special Tests    Special Tests  Lumbar    Lumbar Tests  Slump Test;Straight Leg Raise      Slump test   Findings  Positive    Comment  +bilaterally as it recreated his L buttock pain with testing on BLE      Ambulation/Gait   Ambulation Distance (Feet)  --    Gait Comments  gross discomfort with ambulation as noted throughout amb in clinic      Balance   Balance Assessed  Yes      Static Standing Balance   Static Standing - Balance Support  No upper extremity supported    Static Standing Balance -  Activities   Single Leg Stance - Right Leg;Single Leg Stance - Left Leg    Static Standing - Comment/# of Minutes  R: 10sec L: 1 sec or <      Standardized Balance Assessment  Standardized Balance Assessment  Five Times Sit to Stand    Five times sit to stand comments   perform next visit          Objective measurements completed on examination: See above findings.         PT Education - 09/05/18 1125    Education Details  exam findings, HEP, POC    Person(s) Educated  Patient    Methods  Explanation;Demonstration;Handout    Comprehension  Verbalized understanding;Returned  demonstration       PT Short Term Goals - 09/05/18 1131      PT SHORT TERM GOAL #1   Title  Pt will be independent with HEP and perform consistently in order to reduce overall pain.    Time  2    Period  Weeks    Status  New    Target Date  09/19/18      PT SHORT TERM GOAL #2   Title  Pt will have reduced pain to 4/10 on a daily basis to maximize his overall function at home and return to PLOF.    Time  2    Period  Weeks    Status  New      PT SHORT TERM GOAL #3   Title  Pt will have improved lumbar AROM by 5deg or > throughout all to demo reduced soft tissue restrictions and improve his overall pain.     Time  2    Period  Weeks    Status  New        PT Long Term Goals - 09/05/18 1132      PT LONG TERM GOAL #1   Title  Pt will report being able to stand and walk for 10 mins to allow him to prepare a small meal and perform light HH duties with greater ease and with less pain.     Time  4    Period  Weeks    Status  New    Target Date  10/03/18      PT LONG TERM GOAL #2   Title  Pt will have improved MMT by 1/2 grade in order to reduce his pain and improve functional mobility.    Time  4    Period  Weeks    Status  New      PT LONG TERM GOAL #3   Title  Pt will be able to perform L SLS for 10 sec in order to demo improved functional hip and core strength and to maximize his walking ability.     Time  4    Period  Weeks    Status  New      PT LONG TERM GOAL #4   Title  Pt will be able to perform 5xSTS in 15 sec or < with proper form and with 4/10 pain or < in order to demo improved functional strength and maximize his transitional movements with greater ease.     Time  4    Period  Weeks    Status  New             Plan - 09/05/18 1127    Clinical Impression Statement  Pt is pleasant 68YO M who presents to OPPT with c/o chronic pain in lower back/sacral region with pain into L buttock. Pt presents with deficits in lumbar ROM, MMT, functional strength,  overall joint mobility, soft tissue restrictions, and limitations due to pain. Pt + for slump testing reporting  that his L hip pain was recreated with this test BLE. During MMT, pt reporting so much pain when laying on his L hip that he felt like he was going to throw up; PT assisted pt to sitting position and he dry heaved a few times but never actually vomited. Once that feeling subsided, pt agreeable to complete assessment. With pt in standing, pt noted to have increased restrictions in bil hip mm, L>R, and reporting that palpation to L glute max/over piriformis mm origin/insertion, recreated his same pain.  He was unable to cross either LE over the other, indicating restrictions in hip joint and/or soft tissue mobility. Pt's dermatomes and myotomes as well as DTRs all WNL, however, his s/s consistent with neuromuscular (+slump testing) and MSK impairments and needs skilled PT intervention to address impairments in order to reduce his pain and improve overall function. Pt wishing to come only 1x/week due to high copay.    Clinical Presentation  Evolving    Clinical Decision Making  Moderate    Rehab Potential  Fair    PT Frequency  1x / week    PT Duration  4 weeks    PT Treatment/Interventions  ADLs/Self Care Home Management;Aquatic Therapy;Cryotherapy;Electrical Stimulation;Traction;DME Instruction;Gait training;Functional mobility training;Therapeutic exercise;Therapeutic activities;Moist Heat;Balance training;Neuromuscular re-education;Patient/family education;Stair training;Orthotic Fit/Training;Manual techniques;Passive range of motion;Dry needling;Taping;Spinal Manipulations;Joint Manipulations    PT Next Visit Plan  reveiw goals, complete FOTO and 5xSTS, perform BLE, functional, and core strengthening as able within pain tolerance; attempt lumbar and hip stretching within tolerance, manual as able to pain control    PT Home Exercise Plan  eval: seated lumbar flexion stretch (to stretch glutes)     Consulted and Agree with Plan of Care  Patient       Patient will benefit from skilled therapeutic intervention in order to improve the following deficits and impairments:  Abnormal gait, Decreased activity tolerance, Decreased balance, Decreased endurance, Decreased mobility, Decreased range of motion, Decreased strength, Difficulty walking, Hypomobility, Increased edema, Increased fascial restricitons, Increased muscle spasms, Impaired flexibility  Visit Diagnosis: Pain in left hip - Plan: PT plan of care cert/re-cert  Chronic left-sided low back pain, unspecified whether sciatica present - Plan: PT plan of care cert/re-cert  Difficulty in walking, not elsewhere classified - Plan: PT plan of care cert/re-cert     Problem List Patient Active Problem List   Diagnosis Date Noted  . Acute on chronic diastolic CHF (congestive heart failure) ([place]) 08/13/2016  . Acute respiratory failure with hypoxia ([place]) 08/13/2016  . Acute renal insufficiency 08/13/2016  . Elevated troponin I level 08/13/2016  . Guaiac positive stools 03/01/2016  . Pain in joint, shoulder region 06/19/2014  . ETOH abuse 06/19/2014  . Thrombocytopenia ([place]) 06/19/2014  . Morbid obesity ([place]) 06/19/2014  . Gout   . SOB (shortness of breath) 06/18/2014  . CHF with unknown LVEF ([place]) 06/18/2014  . Fever 06/18/2014  . CAP (community acquired pneumonia) 06/18/2014  . Pleural effusion, bilateral 06/18/2014  . Hypokalemia 06/18/2014  . Hypoxia 06/18/2014  . Hypertension   . Essential hypertension         Geraldine Solar PT, [place] [address] [place], [place], [address] Phone: [phone]   Fax:  [phone]  Name: Joseph Chen MRN: [id number] Date of Birth: Feb 09, 1951

## 2018-09-12 ENCOUNTER — Encounter (HOSPITAL_COMMUNITY): Payer: Self-pay

## 2018-09-12 ENCOUNTER — Ambulatory Visit (HOSPITAL_COMMUNITY): Payer: Medicare HMO | Attending: Family Medicine

## 2018-09-12 DIAGNOSIS — M545 Low back pain: Secondary | ICD-10-CM | POA: Insufficient documentation

## 2018-09-12 DIAGNOSIS — M25552 Pain in left hip: Secondary | ICD-10-CM | POA: Diagnosis not present

## 2018-09-12 DIAGNOSIS — G8929 Other chronic pain: Secondary | ICD-10-CM | POA: Insufficient documentation

## 2018-09-12 DIAGNOSIS — R262 Difficulty in walking, not elsewhere classified: Secondary | ICD-10-CM

## 2018-09-12 NOTE — Therapy (Signed)
[place], [place], [address] Phone: [phone]   Fax:  [phone]  Physical Therapy Treatment  Patient Details  Name: Joseph Chen MRN: [id number] Date of Birth: 04-02-1951 Referring Provider (PT): Curtis Sites, MD   Encounter Date: 09/12/2018  PT End of Session - 09/12/18 0905    Visit Number  2    Number of Visits  5    Date for PT Re-Evaluation  10/03/18    Authorization Type  [employer] H[employer]    Authorization Time Period  09/05/18 to 10/03/18    Authorization - Visit Number  2    Authorization - Number of Visits  10    PT Start Time  0901    PT Stop Time  0940    PT Time Calculation (min)  39 min    Activity Tolerance  Patient limited by pain    Behavior During Therapy  [facility] for tasks assessed/performed       Past Medical History:  Diagnosis Date  . Arthritis   . Asthma    Childhood  . COPD with emphysema ([place])   . Diastolic dysfunction    Grade 2, LVEF 50-55% 2015  . Essential hypertension   . Gout   . History of pneumonia   . Secondary pulmonary hypertension    PASP 78 mmHg 2015    Past Surgical History:  Procedure Laterality Date  . CIRCUMCISION    . COLONOSCOPY N/A 04/21/2016   Procedure: COLONOSCOPY;  Surgeon: Rogene Houston, MD;  [facility];  Service: Endoscopy;  Laterality: N/A;  2:00  . dental extractions    . DENTAL SURGERY    . VASECTOMY      There were no vitals filed for this visit.  Subjective Assessment - 09/12/18 0903    Subjective  Pt reports he feels like he is moving better, had some increased pain riding here with increased Lt buttock pain, pain scale 5-6/10 dull achey pain intermittent pain depending upon surface     Patient Stated Goals  reduce pain in back    Currently in Pain?  Yes    Pain Score  6     Pain Location  Buttocks    Pain Orientation  Left    Pain Descriptors / Indicators  Dull;Aching    Pain Type  Chronic pain    Pain  Onset  More than a month ago    Pain Frequency  Intermittent    Aggravating Factors   movement    Pain Relieving Factors  finding a comfortable position    Effect of Pain on Daily Activities  increases         [facility] PT Assessment - 09/12/18 0001      Assessment   Medical Diagnosis  LBP    Referring Provider (PT)  Curtis Sites, MD    Onset Date/Surgical Date  --   last Fall or Spring   Hand Dominance  Right    Next MD Visit  10/05/18    Prior Therapy  none      Observation/Other Assessments   Focus on Therapeutic Outcomes (FOTO)   53% limited      Standardized Balance Assessment   Standardized Balance Assessment  Five Times Sit to Stand    Five times sit to stand comments   19.17" no HHA                   [facility] Adult PT Treatment/Exercise -  09/12/18 0001      Bed Mobility   Bed Mobility  Sit to Sidelying Right    Sit to Sidelying Right  Supervision/Verbal cueing   cueing for mechanics     Exercises   Exercises  Knee/Hip      Knee/Hip Exercises: Stretches   Other Knee/Hip Stretches  SKTC 3x 30" with towel assistance; LTR 5x 10"    Other Knee/Hip Stretches  seated lumbar flexion      Manual Therapy   Manual Therapy  Soft tissue mobilization    Manual therapy comments  Manual complete separate than rest of tx    Soft tissue mobilization  Rt sidelying with pillow between knees, STM to gluteal mm               PT Short Term Goals - 09/05/18 1131      PT SHORT TERM GOAL #1   Title  Pt will be independent with HEP and perform consistently in order to reduce overall pain.    Time  2    Period  Weeks    Status  New    Target Date  09/19/18      PT SHORT TERM GOAL #2   Title  Pt will have reduced pain to 4/10 on a daily basis to maximize his overall function at home and return to PLOF.    Time  2    Period  Weeks    Status  New      PT SHORT TERM GOAL #3   Title  Pt will have improved lumbar AROM by 5deg or > throughout all to demo  reduced soft tissue restrictions and improve his overall pain.     Time  2    Period  Weeks    Status  New        PT Long Term Goals - 09/05/18 1132      PT LONG TERM GOAL #1   Title  Pt will report being able to stand and walk for 10 mins to allow him to prepare a small meal and perform light HH duties with greater ease and with less pain.     Time  4    Period  Weeks    Status  New    Target Date  10/03/18      PT LONG TERM GOAL #2   Title  Pt will have improved MMT by 1/2 grade in order to reduce his pain and improve functional mobility.    Time  4    Period  Weeks    Status  New      PT LONG TERM GOAL #3   Title  Pt will be able to perform L SLS for 10 sec in order to demo improved functional hip and core strength and to maximize his walking ability.     Time  4    Period  Weeks    Status  New      PT LONG TERM GOAL #4   Title  Pt will be able to perform 5xSTS in 15 sec or < with proper form and with 4/10 pain or < in order to demo improved functional strength and maximize his transitional movements with greater ease.     Time  4    Period  Weeks    Status  New            Plan - 09/12/18 1806    Clinical Impression Statement  Reviewed goals.  FOTO complete  with limitations at 53% self perceived functional abilities.  Able to complete 5 STS in 19.17" without HHA, pt will benefit from proper mechanics.  Therex focus on stretches for lower back and gluteal mobility.  EOS with manual soft tissue mobilization to address restrictions noted with tightness in gluteal muscualture.  Pt limited by pain through session.      Rehab Potential  Fair    PT Frequency  1x / week    PT Duration  4 weeks    PT Treatment/Interventions  ADLs/Self Care Home Management;Aquatic Therapy;Cryotherapy;Electrical Stimulation;Traction;DME Instruction;Gait training;Functional mobility training;Therapeutic exercise;Therapeutic activities;Moist Heat;Balance training;Neuromuscular  re-education;Patient/family education;Stair training;Orthotic Fit/Training;Manual techniques;Passive range of motion;Dry needling;Taping;Spinal Manipulations;Joint Manipulations    PT Next Visit Plan  Perform BLE, functional, and core strengthening as able within pain tolerance; attempt lumbar and hip stretching within tolerance, manual as able to pain control    PT Home Exercise Plan  eval: seated lumbar flexion stretch (to stretch glutes)       Patient will benefit from skilled therapeutic intervention in order to improve the following deficits and impairments:  Abnormal gait, Decreased activity tolerance, Decreased balance, Decreased endurance, Decreased mobility, Decreased range of motion, Decreased strength, Difficulty walking, Hypomobility, Increased edema, Increased fascial restricitons, Increased muscle spasms, Impaired flexibility  Visit Diagnosis: Pain in left hip  Chronic left-sided low back pain, unspecified whether sciatica present  Difficulty in walking, not elsewhere classified     Problem List Patient Active Problem List   Diagnosis Date Noted  . Acute on chronic diastolic CHF (congestive heart failure) ([place]) 08/13/2016  . Acute respiratory failure with hypoxia ([place]) 08/13/2016  . Acute renal insufficiency 08/13/2016  . Elevated troponin I level 08/13/2016  . Guaiac positive stools 03/01/2016  . Pain in joint, shoulder region 06/19/2014  . ETOH abuse 06/19/2014  . Thrombocytopenia ([place]) 06/19/2014  . Morbid obesity ([place]) 06/19/2014  . Gout   . SOB (shortness of breath) 06/18/2014  . CHF with unknown LVEF ([place]) 06/18/2014  . Fever 06/18/2014  . CAP (community acquired pneumonia) 06/18/2014  . Pleural effusion, bilateral 06/18/2014  . Hypokalemia 06/18/2014  . Hypoxia 06/18/2014  . Hypertension   . Essential hypertension    Ihor Austin, [place]; [place]  Aldona Lento 09/12/2018, 6:17 PM  [place] [address] [place], [place], [address] Phone: [phone]   Fax:  [phone]  Name: Joseph Chen MRN: [id number] Date of Birth: [birth year]

## 2018-09-20 ENCOUNTER — Encounter (HOSPITAL_COMMUNITY): Payer: Self-pay

## 2018-09-20 ENCOUNTER — Ambulatory Visit (HOSPITAL_COMMUNITY): Payer: Medicare HMO

## 2018-09-20 DIAGNOSIS — M545 Low back pain, unspecified: Secondary | ICD-10-CM

## 2018-09-20 DIAGNOSIS — G8929 Other chronic pain: Secondary | ICD-10-CM

## 2018-09-20 DIAGNOSIS — M25552 Pain in left hip: Secondary | ICD-10-CM | POA: Diagnosis not present

## 2018-09-20 DIAGNOSIS — R262 Difficulty in walking, not elsewhere classified: Secondary | ICD-10-CM

## 2018-09-20 NOTE — Patient Instructions (Signed)
Supine    Lie on back, legs bent and feet flat. Grasp behind one leg and slowly try to straighten knee. Hold 30 seconds.  Repeat 3 times per session. Do 2 sessions per day.  Copyright  VHI. All rights reserved.   Decompression exercise

## 2018-09-20 NOTE — Therapy (Signed)
[place], [place], [address] Phone: [phone]   Fax:  [phone]  Physical Therapy Treatment  Patient Details  Name: Joseph Chen MRN: [id number] Date of Birth: January 10, 1951 Referring Provider (PT): Curtis Sites, MD   Encounter Date: 09/20/2018  PT End of Session - 09/20/18 0913    Visit Number  3    Number of Visits  5    Date for PT Re-Evaluation  10/03/18    Authorization Type  [employer]    Authorization Time Period  09/05/18 to 10/03/18    Authorization - Visit Number  3    Authorization - Number of Visits  10    PT Start Time  0901    PT Stop Time  0940    PT Time Calculation (min)  39 min    Activity Tolerance  Patient limited by pain;Patient tolerated treatment well    Behavior During Therapy  [facility] for tasks assessed/performed       Past Medical History:  Diagnosis Date  . Arthritis   . Asthma    Childhood  . COPD with emphysema ([place])   . Diastolic dysfunction    Grade 2, LVEF 50-55% 2015  . Essential hypertension   . Gout   . History of pneumonia   . Secondary pulmonary hypertension    PASP 78 mmHg 2015    Past Surgical History:  Procedure Laterality Date  . CIRCUMCISION    . COLONOSCOPY N/A 04/21/2016   Procedure: COLONOSCOPY;  Surgeon: Rogene Houston, MD;  [facility];  Service: Endoscopy;  Laterality: N/A;  2:00  . dental extractions    . DENTAL SURGERY    . VASECTOMY      There were no vitals filed for this visit.  Subjective Assessment - 09/20/18 0901    Subjective  Pt reports he feels good today, reports pain last session getting off of the table.  Current pain 2-3/10 middle of lower back    Patient Stated Goals  reduce pain in back    Currently in Pain?  Yes    Pain Score  3     Pain Location  Back    Pain Orientation  Lower    Pain Descriptors / Indicators  Aching;Dull    Pain Onset  More than a month ago    Pain Frequency  Intermittent    Aggravating Factors   movement    Pain Relieving Factors  finding a comfortable position    Effect of Pain on Daily Activities  increases                       [facility] Adult PT Treatment/Exercise - 09/20/18 0001      Bed Mobility   Bed Mobility  Sit to Sidelying Right    Sit to Sidelying Right  Supervision/Verbal cueing   educated log rolling mechanics     Knee/Hip Exercises: Stretches   Active Hamstring Stretch  2 reps;30 seconds    Active Hamstring Stretch Limitations  supine hands behind knee    Other Knee/Hip Stretches  SKTC 3x 30" with towel assistance; LTR 5x 10"    Other Knee/Hip Stretches  seated lumbar flexion      Knee/Hip Exercises: Supine   Other Supine Knee/Hip Exercises  Decompression ex 1-5 5x3" holds               PT Short Term Goals - 09/05/18 1131  PT SHORT TERM GOAL #1   Title  Pt will be independent with HEP and perform consistently in order to reduce overall pain.    Time  2    Period  Weeks    Status  New    Target Date  09/19/18      PT SHORT TERM GOAL #2   Title  Pt will have reduced pain to 4/10 on a daily basis to maximize his overall function at home and return to PLOF.    Time  2    Period  Weeks    Status  New      PT SHORT TERM GOAL #3   Title  Pt will have improved lumbar AROM by 5deg or > throughout all to demo reduced soft tissue restrictions and improve his overall pain.     Time  2    Period  Weeks    Status  New        PT Long Term Goals - 09/05/18 1132      PT LONG TERM GOAL #1   Title  Pt will report being able to stand and walk for 10 mins to allow him to prepare a small meal and perform light HH duties with greater ease and with less pain.     Time  4    Period  Weeks    Status  New    Target Date  10/03/18      PT LONG TERM GOAL #2   Title  Pt will have improved MMT by 1/2 grade in order to reduce his pain and improve functional mobility.    Time  4    Period  Weeks    Status  New      PT  LONG TERM GOAL #3   Title  Pt will be able to perform L SLS for 10 sec in order to demo improved functional hip and core strength and to maximize his walking ability.     Time  4    Period  Weeks    Status  New      PT LONG TERM GOAL #4   Title  Pt will be able to perform 5xSTS in 15 sec or < with proper form and with 4/10 pain or < in order to demo improved functional strength and maximize his transitional movements with greater ease.     Time  4    Period  Weeks    Status  New            Plan - 09/20/18 4132    Clinical Impression Statement  Session focus on lumbar mobility.  Added hamstring stretches to improve flexibility and decompression exercises to improve tolerance with supine position.  No reports of increased pain through session.  Pt with reports of pain during transition movements, educated on benefits of log rolling to reduce strain on lower back.      Rehab Potential  Fair    PT Frequency  1x / week    PT Duration  4 weeks    PT Treatment/Interventions  ADLs/Self Care Home Management;Aquatic Therapy;Cryotherapy;Electrical Stimulation;Traction;DME Instruction;Gait training;Functional mobility training;Therapeutic exercise;Therapeutic activities;Moist Heat;Balance training;Neuromuscular re-education;Patient/family education;Stair training;Orthotic Fit/Training;Manual techniques;Passive range of motion;Dry needling;Taping;Spinal Manipulations;Joint Manipulations    PT Next Visit Plan  Perform BLE, functional, and core strengthening as able within pain tolerance; attempt lumbar and hip stretching within tolerance, manual as able to pain control    PT Home Exercise Plan  eval: seated lumbar flexion stretch (to  stretch glutes)       Patient will benefit from skilled therapeutic intervention in order to improve the following deficits and impairments:  Abnormal gait, Decreased activity tolerance, Decreased balance, Decreased endurance, Decreased mobility, Decreased range of  motion, Decreased strength, Difficulty walking, Hypomobility, Increased edema, Increased fascial restricitons, Increased muscle spasms, Impaired flexibility  Visit Diagnosis: Pain in left hip  Chronic left-sided low back pain, unspecified whether sciatica present  Difficulty in walking, not elsewhere classified     Problem List Patient Active Problem List   Diagnosis Date Noted  . Acute on chronic diastolic CHF (congestive heart failure) ([place]) 08/13/2016  . Acute respiratory failure with hypoxia ([place]) 08/13/2016  . Acute renal insufficiency 08/13/2016  . Elevated troponin I level 08/13/2016  . Guaiac positive stools 03/01/2016  . Pain in joint, shoulder region 06/19/2014  . ETOH abuse 06/19/2014  . Thrombocytopenia ([place]) 06/19/2014  . Morbid obesity ([place]) 06/19/2014  . Gout   . SOB (shortness of breath) 06/18/2014  . CHF with unknown LVEF ([place]) 06/18/2014  . Fever 06/18/2014  . CAP (community acquired pneumonia) 06/18/2014  . Pleural effusion, bilateral 06/18/2014  . Hypokalemia 06/18/2014  . Hypoxia 06/18/2014  . Hypertension   . Essential hypertension    Ihor Austin, [place]; [place]  Aldona Lento 09/20/2018, 9:41 AM  [facility] [address] [place], [place], [address] Phone: [phone]   Fax:  [phone]  Name: Joseph Chen MRN: [id number] Date of Birth: 1950/09/23

## 2018-09-26 ENCOUNTER — Ambulatory Visit (HOSPITAL_COMMUNITY): Payer: Medicare HMO

## 2018-09-26 ENCOUNTER — Encounter (HOSPITAL_COMMUNITY): Payer: Self-pay

## 2018-09-26 DIAGNOSIS — M25552 Pain in left hip: Secondary | ICD-10-CM

## 2018-09-26 DIAGNOSIS — G8929 Other chronic pain: Secondary | ICD-10-CM

## 2018-09-26 DIAGNOSIS — M545 Low back pain: Secondary | ICD-10-CM

## 2018-09-26 DIAGNOSIS — R262 Difficulty in walking, not elsewhere classified: Secondary | ICD-10-CM

## 2018-09-26 NOTE — Therapy (Signed)
[place], [place], [address] Phone: [phone]   Fax:  [phone]  Physical Therapy Treatment  Patient Details  Name: Joseph Chen MRN: [id number] Date of Birth: 1951-01-24 Referring Provider (PT): Curtis Sites, MD   Encounter Date: 09/26/2018  PT End of Session - 09/26/18 0907    Visit Number  4    Number of Visits  5    Date for PT Re-Evaluation  10/03/18    Authorization Type  [employer] H[employer]    Authorization Time Period  09/05/18 to 10/03/18    Authorization - Visit Number  4    Authorization - Number of Visits  10    PT Start Time  0902    PT Stop Time  0943    PT Time Calculation (min)  41 min    Activity Tolerance  Patient tolerated treatment well;No increased pain    Behavior During Therapy  WFL for tasks assessed/performed       Past Medical History:  Diagnosis Date  . Arthritis   . Asthma    Childhood  . COPD with emphysema ([place])   . Diastolic dysfunction    Grade 2, LVEF 50-55% 2015  . Essential hypertension   . Gout   . History of pneumonia   . Secondary pulmonary hypertension    PASP 78 mmHg 2015    Past Surgical History:  Procedure Laterality Date  . CIRCUMCISION    . COLONOSCOPY N/A 04/21/2016   Procedure: COLONOSCOPY;  Surgeon: Rogene Houston, MD;  [facility];  Service: Endoscopy;  Laterality: N/A;  2:00  . dental extractions    . DENTAL SURGERY    . VASECTOMY      Vitals:   09/26/18 0904  SpO2: 98%    Subjective Assessment - 09/26/18 0904    Subjective  Pt stated he feels good today, current pain scale 3/10    Patient Stated Goals  reduce pain in back    Currently in Pain?  Yes    Pain Score  3     Pain Location  Back    Pain Orientation  Lower                       [facility] Adult PT Treatment/Exercise - 09/26/18 0001      Knee/Hip Exercises: Stretches   Active Hamstring Stretch  2 reps;30 seconds    Active Hamstring  Stretch Limitations  supine hands behind knee    Other Knee/Hip Stretches  SKTC 2x 30" with towel assistance    Other Knee/Hip Stretches  seated lumbar flexion      Knee/Hip Exercises: Standing   Other Standing Knee Exercises  tandem stance 2x 30"       Knee/Hip Exercises: Seated   Sit to Sand  10 reps;without UE support   eccentric control     Knee/Hip Exercises: Supine   Bridges  10 reps;2 sets    Bridges Limitations  3" holds      Manual Therapy   Manual Therapy  Soft tissue mobilization    Manual therapy comments  Manual complete separate than rest of tx    Soft tissue mobilization  Rt sidelying with pillow between knees, STM to Lt gluteal mm             PT Education - 09/26/18 0940    Education Details  Reviewed benefits of compliance with HEP    Person(s) Educated  Patient    Methods  Handout;Explanation    Comprehension  Verbalized understanding       PT Short Term Goals - 09/05/18 1131      PT SHORT TERM GOAL #1   Title  Pt will be independent with HEP and perform consistently in order to reduce overall pain.    Time  2    Period  Weeks    Status  New    Target Date  09/19/18      PT SHORT TERM GOAL #2   Title  Pt will have reduced pain to 4/10 on a daily basis to maximize his overall function at home and return to PLOF.    Time  2    Period  Weeks    Status  New      PT SHORT TERM GOAL #3   Title  Pt will have improved lumbar AROM by 5deg or > throughout all to demo reduced soft tissue restrictions and improve his overall pain.     Time  2    Period  Weeks    Status  New        PT Long Term Goals - 09/05/18 1132      PT LONG TERM GOAL #1   Title  Pt will report being able to stand and walk for 10 mins to allow him to prepare a small meal and perform light HH duties with greater ease and with less pain.     Time  4    Period  Weeks    Status  New    Target Date  10/03/18      PT LONG TERM GOAL #2   Title  Pt will have improved MMT by 1/2  grade in order to reduce his pain and improve functional mobility.    Time  4    Period  Weeks    Status  New      PT LONG TERM GOAL #3   Title  Pt will be able to perform L SLS for 10 sec in order to demo improved functional hip and core strength and to maximize his walking ability.     Time  4    Period  Weeks    Status  New      PT LONG TERM GOAL #4   Title  Pt will be able to perform 5xSTS in 15 sec or < with proper form and with 4/10 pain or < in order to demo improved functional strength and maximize his transitional movements with greater ease.     Time  4    Period  Weeks    Status  New            Plan - 09/26/18 1225    Clinical Impression Statement  Continued session focus with lumbar mobilty and additional proximal strengthening.  Added bridges and STS for lumbar extension and gluteal strengthening as well as tandem stance for balance.  Pt able to complete all mat exercises with no reports of increased pain.  Noted decreased activity tolerance wiht standing, able to complete 1 set of tandem stance prior need to rest.  Improved mechanics with bed mobilty and improved tolerance with Lt hip weight bearing.  EOS with manual to address gluteal tightness with reports of relief following.      Rehab Potential  Fair    PT Frequency  1x / week    PT Duration  4 weeks    PT Treatment/Interventions  ADLs/Self  Care Home Management;Aquatic Therapy;Cryotherapy;Electrical Stimulation;Traction;DME Instruction;Gait training;Functional mobility training;Therapeutic exercise;Therapeutic activities;Moist Heat;Balance training;Neuromuscular re-education;Patient/family education;Stair training;Orthotic Fit/Training;Manual techniques;Passive range of motion;Dry needling;Taping;Spinal Manipulations;Joint Manipulations    PT Next Visit Plan  Reassess next session.  Perform BLE, functional, and core strengthening as able within pain tolerance; attempt lumbar and hip stretching within tolerance, manual  as able to pain control    PT Home Exercise Plan  eval: seated lumbar flexion stretch (to stretch glutes); 2/12: hamstring stretch; 2/18: bridge       Patient will benefit from skilled therapeutic intervention in order to improve the following deficits and impairments:  Abnormal gait, Decreased activity tolerance, Decreased balance, Decreased endurance, Decreased mobility, Decreased range of motion, Decreased strength, Difficulty walking, Hypomobility, Increased edema, Increased fascial restricitons, Increased muscle spasms, Impaired flexibility  Visit Diagnosis: Pain in left hip  Chronic left-sided low back pain, unspecified whether sciatica present  Difficulty in walking, not elsewhere classified     Problem List Patient Active Problem List   Diagnosis Date Noted  . Acute on chronic diastolic CHF (congestive heart failure) ([place]) 08/13/2016  . Acute respiratory failure with hypoxia ([place]) 08/13/2016  . Acute renal insufficiency 08/13/2016  . Elevated troponin I level 08/13/2016  . Guaiac positive stools 03/01/2016  . Pain in joint, shoulder region 06/19/2014  . ETOH abuse 06/19/2014  . Thrombocytopenia ([place]) 06/19/2014  . Morbid obesity ([place]) 06/19/2014  . Gout   . SOB (shortness of breath) 06/18/2014  . CHF with unknown LVEF ([place]) 06/18/2014  . Fever 06/18/2014  . CAP (community acquired pneumonia) 06/18/2014  . Pleural effusion, bilateral 06/18/2014  . Hypokalemia 06/18/2014  . Hypoxia 06/18/2014  . Hypertension   . Essential hypertension    Ihor Austin, [place]; [place]  Aldona Lento 09/26/2018, 12:31 PM  [facility] [address] [place], [place], [address] Phone: [phone]   Fax:  [phone]  Name: Ora Mcnatt MRN: [id number] Date of Birth: 05-30-1951

## 2018-09-26 NOTE — Patient Instructions (Signed)
Bridging    Slowly raise buttocks from floor, keeping stomach tight. Repeat 10 times per set. Do 2 sets per session. Do 1-2 sessions per day.  http://orth.exer.us/1096   Copyright  VHI. All rights reserved.   

## 2018-10-04 ENCOUNTER — Ambulatory Visit (HOSPITAL_COMMUNITY): Payer: Medicare HMO

## 2018-10-04 ENCOUNTER — Encounter (HOSPITAL_COMMUNITY): Payer: Self-pay

## 2018-10-04 DIAGNOSIS — M545 Low back pain, unspecified: Secondary | ICD-10-CM

## 2018-10-04 DIAGNOSIS — R262 Difficulty in walking, not elsewhere classified: Secondary | ICD-10-CM

## 2018-10-04 DIAGNOSIS — M25552 Pain in left hip: Secondary | ICD-10-CM | POA: Diagnosis not present

## 2018-10-04 DIAGNOSIS — G8929 Other chronic pain: Secondary | ICD-10-CM

## 2018-10-04 NOTE — Therapy (Signed)
[place], [place], [address] Phone: [phone]   Fax:  [phone]   Progress Note Reporting Period 09/05/18 to 10/04/18  See note below for Objective Data and Assessment of Progress/Goals.   Physical Therapy Treatment  Patient Details  Name: Joseph Chen MRN: [id number] Date of Birth: 10-06-1950 Referring Provider (PT): Curtis Sites, MD   Encounter Date: 10/04/2018  PT End of Session - 10/04/18 0844    Visit Number  5    Number of Visits  7    Date for PT Re-Evaluation  10/03/18    Authorization Type  [employer] H[employer]    Authorization Time Period  09/05/18 to 10/03/18; NEW: 10/04/18 to 10/18/18    Authorization - Visit Number  5    Authorization - Number of Visits  10    PT Start Time  7824    PT Stop Time  0928    PT Time Calculation (min)  41 min    Activity Tolerance  Patient tolerated treatment well;No increased pain    Behavior During Therapy  WFL for tasks assessed/performed       Past Medical History:  Diagnosis Date  . Arthritis   . Asthma    Childhood  . COPD with emphysema ([place])   . Diastolic dysfunction    Grade 2, LVEF 50-55% 2015  . Essential hypertension   . Gout   . History of pneumonia   . Secondary pulmonary hypertension    PASP 78 mmHg 2015    Past Surgical History:  Procedure Laterality Date  . CIRCUMCISION    . COLONOSCOPY N/A 04/21/2016   Procedure: COLONOSCOPY;  Surgeon: Rogene Houston, MD;  [facility];  Service: Endoscopy;  Laterality: N/A;  2:00  . dental extractions    . DENTAL SURGERY    . VASECTOMY      There were no vitals filed for this visit.  Subjective Assessment - 10/04/18 0845    Subjective  Pt states that he is hurting bad this morning. He had been getting better but states that he got out of the bed last night to go to the bathroom, rolled onto his L side and then went to get up and he has been in pain ever since.     Patient  Stated Goals  reduce pain in back    Currently in Pain?  Yes    Pain Score  8     Pain Location  Buttocks    Pain Orientation  Left    Pain Descriptors / Indicators  Throbbing;Constant    Pain Type  Chronic pain    Pain Onset  More than a month ago    Pain Frequency  Intermittent    Aggravating Factors   movement    Pain Relieving Factors  finding a comfortable position    Effect of Pain on Daily Activities  increases         [facility] PT Assessment - 10/04/18 0001      Assessment   Medical Diagnosis  LBP    Referring Provider (PT)  Curtis Sites, MD    Onset Date/Surgical Date  --   last Fall or Spring   Hand Dominance  Right    Next MD Visit  10/05/18    Prior Therapy  none      Observation/Other Assessments   Focus on Therapeutic Outcomes (FOTO)   --   was53% limited     AROM  Lumbar Flexion  60   was 60   Lumbar Extension  20   was 10   Lumbar - Right Side Bend  15   was 15   Lumbar - Left Side Bend  15   was 14   Lumbar - Right Rotation  25% limited   was 25% limited   Lumbar - Left Rotation  25% limited   was 25-50% limited     Strength   Right Hip ABduction  --   unable to lay on L side; was 4+   Left Hip ABduction  4+/5   was 4     Balance   Balance Assessed  Yes      Static Standing Balance   Static Standing - Balance Support  No upper extremity supported    Static Standing Balance -  Activities   Single Leg Stance - Left Leg    Static Standing - Comment/# of Minutes  L: 1sec or <   L: was 1sec or <     Standardized Balance Assessment   Standardized Balance Assessment  Five Times Sit to Stand    Five times sit to stand comments   12.5sec, no HHA, 8/10 pain   was 19.17sec, no HHA            [facility] Adult PT Treatment/Exercise - 10/04/18 0001      Knee/Hip Exercises: Stretches   Passive Hamstring Stretch  Left;1 rep;30 seconds    Piriformis Stretch  Left;2 reps;30 seconds;60 seconds    Piriformis Stretch Limitations  manual assistance  form PT    Soleus Stretch Limitations  sciatic nerve stretching -- HS stretch +ankle pumps x15 reps      Manual Therapy   Manual Therapy  Soft tissue mobilization    Manual therapy comments  Manual complete separate than rest of tx    Soft tissue mobilization  IASTM green ball to L glute, piriformis to reduce pain and restrictions (in R sidelying)             PT Education - 10/04/18 0844    Education Details  reassessment findings    Person(s) Educated  Patient    Methods  Explanation;Demonstration    Comprehension  Verbalized understanding;Returned demonstration       PT Short Term Goals - 10/04/18 0852      PT SHORT TERM GOAL #1   Title  Pt will be independent with HEP and perform consistently in order to reduce overall pain.    Baseline  2/26: "ain't doing many"    Time  2    Period  Weeks    Status  On-going    Target Date  09/19/18      PT SHORT TERM GOAL #2   Title  Pt will have reduced pain to 4/10 on a daily basis to maximize his overall function at home and return to PLOF.    Baseline  2/26: over the last few days he has been essentially pain-free but last night had acute spike in pain    Time  2    Period  Weeks    Status  On-going      PT SHORT TERM GOAL #3   Title  Pt will have improved lumbar AROM by 5deg or > throughout all to demo reduced soft tissue restrictions and improve his overall pain.     Baseline  2/26: see AROM    Time  2    Period  Weeks  Status  On-going        PT Long Term Goals - 10/04/18 0853      PT LONG TERM GOAL #1   Title  Pt will report being able to stand and walk for 10 mins to allow him to prepare a small meal and perform light HH duties with greater ease and with less pain.     Baseline  2/26: about 10 mins for stadning and walking over last few days    Time  4    Period  Weeks    Status  Achieved      PT LONG TERM GOAL #2   Title  Pt will have improved MMT by 1/2 grade in order to reduce his pain and improve  functional mobility.    Baseline  2/26: see MMT    Time  4    Period  Weeks    Status  Partially Met      PT LONG TERM GOAL #3   Title  Pt will be able to perform L SLS for 10 sec in order to demo improved functional hip and core strength and to maximize his walking ability.     Baseline  2/26: LLE 1sec or <    Time  4    Period  Weeks    Status  On-going      PT LONG TERM GOAL #4   Title  Pt will be able to perform 5xSTS in 15 sec or < with proper form and with 4/10 pain or < in order to demo improved functional strength and maximize his transitional movements with greater ease.     Baseline  2/26: 12.5sec no UE, 8/10 pain    Time  4    Period  Weeks    Status  Partially Met            Plan - 10/04/18 0936    Clinical Impression Statement  PT reassessed pt's goals and outcome measures this date. Pt had overall been making good progress with therapy as his pain had been decreasing, however, last night, he rolled over in bed and had an acute increase in his L hip pain. Even during this acute increase in pain, pt has still made progress towards goals as illustrated above. L hip strength improved to 4+/5 (unable to assess R hip strength due to inability to lay on L side), 5xSTS time improved to 12.5sec but was still painful, and his tolerance to standing and walking have improved to 61mns. Due to recurrence of pain, PT recommending extension of PT services and pt agreeable to 1x/week for 2 more weeks due to high copay. Ended with manual HS stretch, sciatic nerve glides, piriformis stretch, and manual IASTM to glutes/piriformis. Pt reporting reduced pain during manual but during sidelying to sit transfer, pt's pain increased again, however, after walking towards exit, he stated that his pain started to come on down. Educated pt to perform HEP at home and trial self-IASTM with racquet ball and he verbalized understanding.    Rehab Potential  Fair    PT Frequency  1x / week    PT Duration   2 weeks    PT Treatment/Interventions  ADLs/Self Care Home Management;Aquatic Therapy;Cryotherapy;Electrical Stimulation;Traction;DME Instruction;Gait training;Functional mobility training;Therapeutic exercise;Therapeutic activities;Moist Heat;Balance training;Neuromuscular re-education;Patient/family education;Stair training;Orthotic Fit/Training;Manual techniques;Passive range of motion;Dry needling;Taping;Spinal Manipulations;Joint Manipulations    PT Next Visit Plan  focus on reducing pain; perform BLE, functional, and core strengthening as able within pain tolerance; attempt lumbar and  hip stretching within tolerance, manual as able to pain control    PT Home Exercise Plan  eval: seated lumbar flexion stretch (to stretch glutes); 2/12: hamstring stretch; 2/18: bridge; 2/26: self-IASTM with racquet ball    Consulted and Agree with Plan of Care  Patient       Patient will benefit from skilled therapeutic intervention in order to improve the following deficits and impairments:  Abnormal gait, Decreased activity tolerance, Decreased balance, Decreased endurance, Decreased mobility, Decreased range of motion, Decreased strength, Difficulty walking, Hypomobility, Increased edema, Increased fascial restricitons, Increased muscle spasms, Impaired flexibility  Visit Diagnosis: Pain in left hip - Plan: PT plan of care cert/re-cert  Chronic left-sided low back pain, unspecified whether sciatica present - Plan: PT plan of care cert/re-cert  Difficulty in walking, not elsewhere classified - Plan: PT plan of care cert/re-cert     Problem List Patient Active Problem List   Diagnosis Date Noted  . Acute on chronic diastolic CHF (congestive heart failure) ([place]) 08/13/2016  . Acute respiratory failure with hypoxia ([place]) 08/13/2016  . Acute renal insufficiency 08/13/2016  . Elevated troponin I level 08/13/2016  . Guaiac positive stools 03/01/2016  . Pain in joint, shoulder region 06/19/2014  . ETOH  abuse 06/19/2014  . Thrombocytopenia ([place]) 06/19/2014  . Morbid obesity ([place]) 06/19/2014  . Gout   . SOB (shortness of breath) 06/18/2014  . CHF with unknown LVEF ([place]) 06/18/2014  . Fever 06/18/2014  . CAP (community acquired pneumonia) 06/18/2014  . Pleural effusion, bilateral 06/18/2014  . Hypokalemia 06/18/2014  . Hypoxia 06/18/2014  . Hypertension   . Essential hypertension         Geraldine Solar PT, [facility] [address] [place], [place], [address] Phone: [phone]   Fax:  [phone]  Name: Joseph Chen MRN: [id number] Date of Birth: 30-Oct-1950

## 2018-10-11 ENCOUNTER — Ambulatory Visit (HOSPITAL_COMMUNITY): Payer: Medicare HMO | Attending: Family Medicine

## 2018-10-11 ENCOUNTER — Encounter (HOSPITAL_COMMUNITY): Payer: Self-pay

## 2018-10-11 DIAGNOSIS — M25552 Pain in left hip: Secondary | ICD-10-CM

## 2018-10-11 DIAGNOSIS — G8929 Other chronic pain: Secondary | ICD-10-CM | POA: Insufficient documentation

## 2018-10-11 DIAGNOSIS — R262 Difficulty in walking, not elsewhere classified: Secondary | ICD-10-CM | POA: Diagnosis present

## 2018-10-11 DIAGNOSIS — M545 Low back pain, unspecified: Secondary | ICD-10-CM

## 2018-10-11 NOTE — Therapy (Signed)
[place], [place], [address] Phone: [phone]   Fax:  [phone]  Physical Therapy Treatment  Patient Details  Name: Joseph Chen MRN: [id number] Date of Birth: 02-23-1951 Referring Provider (PT): Curtis Sites, MD   Encounter Date: 10/11/2018  PT End of Session - 10/11/18 1114    Visit Number  6    Number of Visits  7    Date for PT Re-Evaluation  10/03/18    Authorization Type  [employer] H[employer]    Authorization Time Period  09/05/18 to 10/03/18; NEW: 10/04/18 to 10/18/18    Authorization - Visit Number  6    Authorization - Number of Visits  10    PT Start Time  1115    PT Stop Time  1155    PT Time Calculation (min)  40 min    Activity Tolerance  Patient tolerated treatment well;No increased pain    Behavior During Therapy  WFL for tasks assessed/performed       Past Medical History:  Diagnosis Date  . Arthritis   . Asthma    Childhood  . COPD with emphysema ([place])   . Diastolic dysfunction    Grade 2, LVEF 50-55% 2015  . Essential hypertension   . Gout   . History of pneumonia   . Secondary pulmonary hypertension    PASP 78 mmHg 2015    Past Surgical History:  Procedure Laterality Date  . CIRCUMCISION    . COLONOSCOPY N/A 04/21/2016   Procedure: COLONOSCOPY;  Surgeon: Rogene Houston, MD;  [facility];  Service: Endoscopy;  Laterality: N/A;  2:00  . dental extractions    . DENTAL SURGERY    . VASECTOMY      There were no vitals filed for this visit.  Subjective Assessment - 10/11/18 1115    Subjective  Pt states he had been pain-free from last Thursday to Monday (2 days ago) but now he is in increased pain again. His pain is right in the middle of his back, which started hurting yesterday a little bit and then woke up with more pain this morning.     Patient Stated Goals  reduce pain in back    Currently in Pain?  Yes    Pain Score  5     Pain Location  Back    Pain Orientation  Lower    Pain Descriptors / Indicators  Aching    Pain Type  Chronic pain    Pain Onset  More than a month ago    Pain Frequency  Intermittent    Aggravating Factors   movement    Pain Relieving Factors  finding a comfortable position    Effect of Pain on Daily Activities  increases            [facility] Adult PT Treatment/Exercise - 10/11/18 0001      Knee/Hip Exercises: Stretches   Active Hamstring Stretch  Both;2 reps;30 seconds    Active Hamstring Stretch Limitations  seated, EOB    Other Knee/Hip Stretches  POE x66mn (increased pain)    Other Knee/Hip Stretches  fwd/lat seated lumbar flexion stretch with large phyisoball 5x10" (elbows on ball due to shoulder issues)      Knee/Hip Exercises: Seated   Sit to Sand  10 reps      Knee/Hip Exercises: Supine   Bridges  Both;2 sets;10 reps    Bridges Limitations  cues for proper breathing  Manual Therapy   Manual Therapy  Joint mobilization;Soft tissue mobilization    Manual therapy comments  Manual complete separate than rest of tx    Joint Mobilization  very light CPAs to L1-5 in order to reduce pain; pt most tender at L2-4    Soft tissue mobilization  cross friction and efflurage massage as well as IASTM with green ball to bil lumbar paraspinals to reduce pain             PT Education - 10/11/18 1115    Education Details  exercise technique, added decompression 1-5 to HEP    Person(s) Educated  Patient    Methods  Explanation;Demonstration;Handout    Comprehension  Verbalized understanding;Returned demonstration       PT Short Term Goals - 10/04/18 7893      PT SHORT TERM GOAL #1   Title  Pt will be independent with HEP and perform consistently in order to reduce overall pain.    Baseline  2/26: "ain't doing many"    Time  2    Period  Weeks    Status  On-going    Target Date  09/19/18      PT SHORT TERM GOAL #2   Title  Pt will have reduced pain to 4/10 on a daily basis to maximize his  overall function at home and return to PLOF.    Baseline  2/26: over the last few days he has been essentially pain-free but last night had acute spike in pain    Time  2    Period  Weeks    Status  On-going      PT SHORT TERM GOAL #3   Title  Pt will have improved lumbar AROM by 5deg or > throughout all to demo reduced soft tissue restrictions and improve his overall pain.     Baseline  2/26: see AROM    Time  2    Period  Weeks    Status  On-going        PT Long Term Goals - 10/04/18 8101      PT LONG TERM GOAL #1   Title  Pt will report being able to stand and walk for 10 mins to allow him to prepare a small meal and perform light HH duties with greater ease and with less pain.     Baseline  2/26: about 10 mins for stadning and walking over last few days    Time  4    Period  Weeks    Status  Achieved      PT LONG TERM GOAL #2   Title  Pt will have improved MMT by 1/2 grade in order to reduce his pain and improve functional mobility.    Baseline  2/26: see MMT    Time  4    Period  Weeks    Status  Partially Met      PT LONG TERM GOAL #3   Title  Pt will be able to perform L SLS for 10 sec in order to demo improved functional hip and core strength and to maximize his walking ability.     Baseline  2/26: LLE 1sec or <    Time  4    Period  Weeks    Status  On-going      PT LONG TERM GOAL #4   Title  Pt will be able to perform 5xSTS in 15 sec or < with proper form and with 4/10 pain  or < in order to demo improved functional strength and maximize his transitional movements with greater ease.     Baseline  2/26: 12.5sec no UE, 8/10 pain    Time  4    Period  Weeks    Status  Partially Met            Plan - 10/11/18 1159    Clinical Impression Statement  Pt presents to therapy stating that he was pain-free from Thursday to this past Monday and then his low back pain returned yesterday morning. He stated that his current pain was located at the center of his lower  back and was different from the pain he originally was being seen for; he's not sure what he did to aggravate this. Focused on lumbar stretching and pain control today. Pt tolerated seated lumbar flexion stretching well but during STS, he had increased LBP. Assisted pt to the prone position in order to perform manual therapy for pain relief. Pt reported that the manual felt good during then when assisted him back to sitting, he had increased pain again. Educated pt to try heat and/or ice on his lower back to assist with pain control and to perform decompression 1-5 as part of HEP. Pt's last visit is next session.     Rehab Potential  Fair    PT Frequency  1x / week    PT Duration  2 weeks    PT Treatment/Interventions  ADLs/Self Care Home Management;Aquatic Therapy;Cryotherapy;Electrical Stimulation;Traction;DME Instruction;Gait training;Functional mobility training;Therapeutic exercise;Therapeutic activities;Moist Heat;Balance training;Neuromuscular re-education;Patient/family education;Stair training;Orthotic Fit/Training;Manual techniques;Passive range of motion;Dry needling;Taping;Spinal Manipulations;Joint Manipulations    PT Next Visit Plan  functional reassessment and determine if pt wants to continue or not; focus on reducing pain; perform BLE, functional, and core strengthening as able within pain tolerance; attempt lumbar and hip stretching within tolerance, manual as able to pain control    PT Home Exercise Plan  eval: seated lumbar flexion stretch (to stretch glutes); 2/12: hamstring stretch; 2/18: bridge; 2/26: self-IASTM with racquet ball; 3/4: decompression 1-5    Consulted and Agree with Plan of Care  Patient       Patient will benefit from skilled therapeutic intervention in order to improve the following deficits and impairments:  Abnormal gait, Decreased activity tolerance, Decreased balance, Decreased endurance, Decreased mobility, Decreased range of motion, Decreased strength,  Difficulty walking, Hypomobility, Increased edema, Increased fascial restricitons, Increased muscle spasms, Impaired flexibility  Visit Diagnosis: Pain in left hip  Chronic left-sided low back pain, unspecified whether sciatica present  Difficulty in walking, not elsewhere classified     Problem List Patient Active Problem List   Diagnosis Date Noted  . Acute on chronic diastolic CHF (congestive heart failure) ([place]) 08/13/2016  . Acute respiratory failure with hypoxia ([place]) 08/13/2016  . Acute renal insufficiency 08/13/2016  . Elevated troponin I level 08/13/2016  . Guaiac positive stools 03/01/2016  . Pain in joint, shoulder region 06/19/2014  . ETOH abuse 06/19/2014  . Thrombocytopenia ([place]) 06/19/2014  . Morbid obesity ([place]) 06/19/2014  . Gout   . SOB (shortness of breath) 06/18/2014  . CHF with unknown LVEF ([place]) 06/18/2014  . Fever 06/18/2014  . CAP (community acquired pneumonia) 06/18/2014  . Pleural effusion, bilateral 06/18/2014  . Hypokalemia 06/18/2014  . Hypoxia 06/18/2014  . Hypertension   . Essential hypertension         Geraldine Solar PT, [facility] [address] [place], [place], [address]  Phone: [phone]   Fax:  [phone]  Name: Joseph Chen MRN: [id number] Date of Birth: February 22, 1951

## 2018-10-17 ENCOUNTER — Telehealth (HOSPITAL_COMMUNITY): Payer: Self-pay | Admitting: Family Medicine

## 2018-10-17 NOTE — Telephone Encounter (Signed)
10/17/18  Pt left a message to cx but didn't give a reason

## 2018-10-18 ENCOUNTER — Ambulatory Visit (HOSPITAL_COMMUNITY): Payer: Medicare HMO

## 2018-11-23 ENCOUNTER — Telehealth (HOSPITAL_COMMUNITY): Payer: Self-pay

## 2018-11-23 NOTE — Telephone Encounter (Signed)
Called to let Joseph Chen know that our clinic will be closed until further notice due to COVID-19. He had cancelled his last scheduled appt on 10/17/18 so PT called to inquire if he wanted to reschedule that once our clinic reopens or if he wanted to go ahead and be discharged. Pt said to call him when we reopen and he would let us know how he's feeling/if he wants to reschedule.  Geraldine Solar PT, DPT

## 2018-12-19 ENCOUNTER — Telehealth (HOSPITAL_COMMUNITY): Payer: Self-pay

## 2018-12-19 NOTE — Telephone Encounter (Signed)
I called Mr. Joseph Chen and confirmed his identity. I informed him our office is re-opening and that we were calling to check in and see if he would like to return for a follow up or if he feels he is doing well and no longer needs physical therapy. He stated his hip is much better and he does not want to return. I told him we would discharge him and that if he feels he needs to come back he will need a new referral from him MD. He understood.  Kipp Brood, PT, DPT, [facility] Physical Therapist with [facility]  12/19/2018 4:07 PM

## 2019-01-26 IMAGING — NM NM MYOCAR MULTI W/SPECT W/WALL MOTION & EF
2 series · 12 of 12 positions shown · non-contrast
Comparison: none

[Series 1: rest · 6.51mm/px · 6 of 64 frames shown]
[frame 6/64]
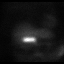
[frame 16/64]
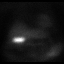
[frame 27/64]
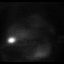
[frame 38/64]
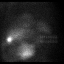
[frame 48/64]
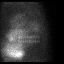
[frame 59/64]
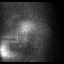

[Series 3: stress gated - perfusion · 6.51mm/px · 6 of 64 frames shown]
[frame 6/64]
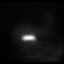
[frame 16/64]
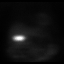
[frame 27/64]
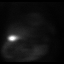
[frame 38/64]
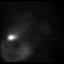
[frame 48/64]
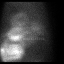
[frame 59/64]
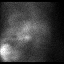

[12 of 12 positions shown; findings below may reference images not displayed]

Canned report from images found in remote index.

Refer to host system for actual result text.

## 2019-02-20 ENCOUNTER — Encounter (HOSPITAL_COMMUNITY): Payer: Self-pay

## 2019-02-20 NOTE — Therapy (Signed)
[place], [place], [address] Phone: [phone]   Fax:  [phone]  Patient Details  Name: Joseph Chen MRN: [id number] Date of Birth: [birth year] Referring Provider:  No ref. provider found  Encounter Date: 02/20/2019   PHYSICAL THERAPY DISCHARGE SUMMARY  Visits from Start of Care: 6  Current functional level related to goals / functional outcomes: See last note   Remaining deficits: See last note   Education / Equipment: n/a  Plan: Patient agrees to discharge.  Patient goals were partially met. Patient is being discharged due to not returning since the last visit.  ?????      Geraldine Solar PT, [facility] [address] [place], [place], [address] Phone: [phone]   Fax:  [phone]

## 2019-04-10 DEATH — deceased

## 2021-04-07 ENCOUNTER — Encounter (INDEPENDENT_AMBULATORY_CARE_PROVIDER_SITE_OTHER): Payer: Self-pay | Admitting: *Deleted
